# Patient Record
Sex: Male | Born: 1960
Health system: Southern US, Community
[De-identification: ages and names within clinical notes are randomized; demographics above are authoritative.]

## PROBLEM LIST (undated history)

## (undated) DIAGNOSIS — R0602 Shortness of breath: Secondary | ICD-10-CM

## (undated) DIAGNOSIS — J449 Chronic obstructive pulmonary disease, unspecified: Secondary | ICD-10-CM

## (undated) DIAGNOSIS — J111 Influenza due to unidentified influenza virus with other respiratory manifestations: Secondary | ICD-10-CM

## (undated) DIAGNOSIS — J189 Pneumonia, unspecified organism: Secondary | ICD-10-CM

## (undated) DIAGNOSIS — M1990 Unspecified osteoarthritis, unspecified site: Secondary | ICD-10-CM

## (undated) DIAGNOSIS — J40 Bronchitis, not specified as acute or chronic: Secondary | ICD-10-CM

## (undated) HISTORY — PX: NO PAST SURGERIES: SHX2092

---

## 2010-05-30 ENCOUNTER — Emergency Department (HOSPITAL_COMMUNITY)
Admission: EM | Admit: 2010-05-30 | Discharge: 2010-05-31 | Disposition: A | Discharge status: Home / Self Care | Payer: Self-pay | Attending: Emergency Medicine | Admitting: Emergency Medicine

## 2010-05-30 DIAGNOSIS — IMO0002 Reserved for concepts with insufficient information to code with codable children: Secondary | ICD-10-CM | POA: Insufficient documentation

## 2013-05-10 ENCOUNTER — Inpatient Hospital Stay (HOSPITAL_COMMUNITY)
Admission: EM | Admit: 2013-05-10 | Discharge: 2013-05-12 | DRG: 193 | Disposition: A | Discharge status: Home / Self Care | Payer: No Typology Code available for payment source | Attending: Internal Medicine | Admitting: Internal Medicine

## 2013-05-10 ENCOUNTER — Observation Stay (HOSPITAL_COMMUNITY): Payer: No Typology Code available for payment source

## 2013-05-10 ENCOUNTER — Encounter (HOSPITAL_COMMUNITY): Payer: Self-pay | Admitting: Emergency Medicine

## 2013-05-10 ENCOUNTER — Emergency Department (HOSPITAL_COMMUNITY): Payer: No Typology Code available for payment source

## 2013-05-10 DIAGNOSIS — R0603 Acute respiratory distress: Secondary | ICD-10-CM | POA: Diagnosis present

## 2013-05-10 DIAGNOSIS — D72829 Elevated white blood cell count, unspecified: Secondary | ICD-10-CM | POA: Diagnosis present

## 2013-05-10 DIAGNOSIS — J189 Pneumonia, unspecified organism: Principal | ICD-10-CM

## 2013-05-10 DIAGNOSIS — J4489 Other specified chronic obstructive pulmonary disease: Secondary | ICD-10-CM

## 2013-05-10 DIAGNOSIS — R0602 Shortness of breath: Secondary | ICD-10-CM

## 2013-05-10 DIAGNOSIS — J9600 Acute respiratory failure, unspecified whether with hypoxia or hypercapnia: Secondary | ICD-10-CM | POA: Diagnosis present

## 2013-05-10 DIAGNOSIS — J449 Chronic obstructive pulmonary disease, unspecified: Secondary | ICD-10-CM

## 2013-05-10 DIAGNOSIS — Z720 Tobacco use: Secondary | ICD-10-CM | POA: Diagnosis present

## 2013-05-10 DIAGNOSIS — R059 Cough, unspecified: Secondary | ICD-10-CM

## 2013-05-10 DIAGNOSIS — F1210 Cannabis abuse, uncomplicated: Secondary | ICD-10-CM | POA: Diagnosis present

## 2013-05-10 DIAGNOSIS — R0902 Hypoxemia: Secondary | ICD-10-CM

## 2013-05-10 DIAGNOSIS — F1410 Cocaine abuse, uncomplicated: Secondary | ICD-10-CM | POA: Diagnosis present

## 2013-05-10 DIAGNOSIS — R05 Cough: Secondary | ICD-10-CM

## 2013-05-10 DIAGNOSIS — I472 Ventricular tachycardia, unspecified: Secondary | ICD-10-CM | POA: Diagnosis not present

## 2013-05-10 DIAGNOSIS — F17200 Nicotine dependence, unspecified, uncomplicated: Secondary | ICD-10-CM

## 2013-05-10 DIAGNOSIS — J441 Chronic obstructive pulmonary disease with (acute) exacerbation: Secondary | ICD-10-CM

## 2013-05-10 DIAGNOSIS — I4729 Other ventricular tachycardia: Secondary | ICD-10-CM | POA: Diagnosis not present

## 2013-05-10 DIAGNOSIS — Z79899 Other long term (current) drug therapy: Secondary | ICD-10-CM

## 2013-05-10 HISTORY — DX: Shortness of breath: R06.02

## 2013-05-10 HISTORY — DX: Pneumonia, unspecified organism: J18.9

## 2013-05-10 HISTORY — DX: Chronic obstructive pulmonary disease, unspecified: J44.9

## 2013-05-10 HISTORY — DX: Unspecified osteoarthritis, unspecified site: M19.90

## 2013-05-10 HISTORY — DX: Bronchitis, not specified as acute or chronic: J40

## 2013-05-10 LAB — BASIC METABOLIC PANEL WITH GFR
GFR calc non Af Amer: >90 mL/min (ref >90)
Glucose, Bld: 103 mg/dL — ABNORMAL HIGH (ref 70–99)
Potassium: 4.1 meq/L (ref 3.7–5.3)

## 2013-05-10 LAB — I-STAT VENOUS BLOOD GAS, ED
Acid-base deficit: 1 mmol/L (ref 0.0–2.0)
Bicarbonate: 23.3 mEq/L (ref 20.0–24.0)
O2 Saturation: 94 %
TCO2: 25 mmol/L (ref 0–100)
pCO2, Ven: 39 mmHg — ABNORMAL LOW (ref 45.0–50.0)
pH, Ven: 7.385 — ABNORMAL HIGH (ref 7.250–7.300)
pO2, Ven: 70 mmHg — ABNORMAL HIGH (ref 30.0–45.0)

## 2013-05-10 LAB — BASIC METABOLIC PANEL
BUN: 9 mg/dL (ref 6–23)
CO2: 23 mEq/L (ref 19–32)
Calcium: 9.5 mg/dL (ref 8.4–10.5)
Chloride: 100 mEq/L (ref 96–112)
Creatinine, Ser: 0.66 mg/dL (ref 0.50–1.35)
GFR calc Af Amer: >90 mL/min (ref >90)
Sodium: 138 mEq/L (ref 137–147)

## 2013-05-10 LAB — CBC WITH DIFFERENTIAL/PLATELET
Basophils Absolute: 0 K/uL (ref 0.0–0.1)
Basophils Relative: 0 % (ref 0–1)
Eosinophils Absolute: 0 K/uL (ref 0.0–0.7)
Eosinophils Relative: 0 % (ref 0–5)
HCT: 46.6 % (ref 39.0–52.0)
Hemoglobin: 16 g/dL (ref 13.0–17.0)
Lymphocytes Relative: 4 % — ABNORMAL LOW (ref 12–46)
Lymphs Abs: 0.8 K/uL (ref 0.7–4.0)
MCH: 30.5 pg (ref 26.0–34.0)
MCHC: 34.3 g/dL (ref 30.0–36.0)
MCV: 88.8 fL (ref 78.0–100.0)
Monocytes Absolute: 1.8 10*3/uL — ABNORMAL HIGH (ref 0.1–1.0)
Monocytes Relative: 9 % (ref 3–12)
Neutro Abs: 17.4 10*3/uL — ABNORMAL HIGH (ref 1.7–7.7)
Neutrophils Relative %: 87 % — ABNORMAL HIGH (ref 43–77)
Platelets: 255 K/uL (ref 150–400)
RBC: 5.25 MIL/uL (ref 4.22–5.81)
RDW: 13.8 % (ref 11.5–15.5)
WBC: 20 K/uL — ABNORMAL HIGH (ref 4.0–10.5)

## 2013-05-10 LAB — TROPONIN I

## 2013-05-10 MED ORDER — ALBUTEROL SULFATE (2.5 MG/3ML) 0.083% IN NEBU
5.0000 mg | INHALATION_SOLUTION | Freq: Once | RESPIRATORY_TRACT | Status: AC
  Administered 2013-05-10: 5 mg via RESPIRATORY_TRACT
  Filled 2013-05-10: qty 6

## 2013-05-10 MED ORDER — PREDNISONE 20 MG PO TABS
40.0000 mg | ORAL_TABLET | Freq: Every day | ORAL | Status: DC
  Filled 2013-05-10 (×2): qty 2

## 2013-05-10 MED ORDER — IPRATROPIUM-ALBUTEROL 0.5-2.5 (3) MG/3ML IN SOLN
3.0000 mL | RESPIRATORY_TRACT | Status: DC
  Administered 2013-05-10 – 2013-05-11 (×7): 3 mL via RESPIRATORY_TRACT
  Filled 2013-05-10 (×6): qty 3

## 2013-05-10 MED ORDER — PNEUMOCOCCAL VAC POLYVALENT 25 MCG/0.5ML IJ INJ
0.5000 mL | INJECTION | INTRAMUSCULAR | Status: AC
  Administered 2013-05-11: 0.5 mL via INTRAMUSCULAR
  Filled 2013-05-10: qty 0.5

## 2013-05-10 MED ORDER — HYDROCOD POLST-CHLORPHEN POLST 10-8 MG/5ML PO LQCR
5.0000 mL | Freq: Once | ORAL | Status: AC
  Administered 2013-05-10: 5 mL via ORAL
  Filled 2013-05-10: qty 5

## 2013-05-10 MED ORDER — ALBUTEROL SULFATE (2.5 MG/3ML) 0.083% IN NEBU
2.5000 mg | INHALATION_SOLUTION | RESPIRATORY_TRACT | Status: DC
  Administered 2013-05-10: 2.5 mg via RESPIRATORY_TRACT
  Filled 2013-05-10: qty 3

## 2013-05-10 MED ORDER — IPRATROPIUM BROMIDE 0.02 % IN SOLN
0.5000 mg | Freq: Once | RESPIRATORY_TRACT | Status: AC
  Administered 2013-05-10: 0.5 mg via RESPIRATORY_TRACT
  Filled 2013-05-10: qty 2.5

## 2013-05-10 MED ORDER — AZITHROMYCIN 250 MG PO TABS
500.0000 mg | ORAL_TABLET | Freq: Once | ORAL | Status: AC
  Administered 2013-05-10: 500 mg via ORAL
  Filled 2013-05-10: qty 2

## 2013-05-10 MED ORDER — ENOXAPARIN SODIUM 40 MG/0.4ML ~~LOC~~ SOLN
40.0000 mg | SUBCUTANEOUS | Status: DC
  Administered 2013-05-10 – 2013-05-11 (×2): 40 mg via SUBCUTANEOUS
  Filled 2013-05-10 (×3): qty 0.4

## 2013-05-10 MED ORDER — ASPIRIN EC 81 MG PO TBEC
81.0000 mg | DELAYED_RELEASE_TABLET | Freq: Every day | ORAL | Status: DC
  Administered 2013-05-10 – 2013-05-12 (×3): 81 mg via ORAL
  Filled 2013-05-10 (×3): qty 1

## 2013-05-10 MED ORDER — AZITHROMYCIN 500 MG PO TABS
500.0000 mg | ORAL_TABLET | Freq: Every day | ORAL | Status: DC
  Filled 2013-05-10: qty 1

## 2013-05-10 MED ORDER — DEXTROSE 5 % IV SOLN
1.0000 g | Freq: Once | INTRAVENOUS | Status: AC
  Administered 2013-05-10: 1 g via INTRAVENOUS
  Filled 2013-05-10: qty 10

## 2013-05-10 MED ORDER — SODIUM CHLORIDE 0.9 % IJ SOLN
3.0000 mL | Freq: Two times a day (BID) | INTRAMUSCULAR | Status: DC
  Administered 2013-05-11 – 2013-05-12 (×3): 3 mL via INTRAVENOUS

## 2013-05-10 MED ORDER — METHYLPREDNISOLONE SODIUM SUCC 125 MG IJ SOLR
125.0000 mg | Freq: Once | INTRAMUSCULAR | Status: AC
  Administered 2013-05-10: 125 mg via INTRAVENOUS
  Filled 2013-05-10: qty 2

## 2013-05-10 MED ORDER — SODIUM CHLORIDE 0.9 % IV SOLN
INTRAVENOUS | Status: AC
  Administered 2013-05-10: 18:00:00 via INTRAVENOUS

## 2013-05-10 NOTE — ED Notes (Signed)
Cough sob and wheezing running a fever x 3 days pulse ox 89 ra 109

## 2013-05-10 NOTE — ED Notes (Signed)
Pt states he has had a cough for several days and chest pain accompanied the coughing. States he is coughing up green/yellow mucous. Pt states he has had a fever, diarrhea but denies vomiting. This has been going on for about 2 days.

## 2013-05-10 NOTE — Progress Notes (Signed)
Pt arrived from the ED via stretcher accompanied by tech on cardiac monitor and O2. Pt with increased work of breathing but stable and in no other apparent discomfort or distress. Pt able to ambulate from ED stretcher to hospital bed without assistance. Vitals obtained - VSS. Pt placed on tele monitor and TMU notified. Dr. Darci Needlehikowski notified of patient's arrival and she stated the team would be up in approx. 1/2 hour to assess and write admission orders. Pt resting comfortably in bed. Bed low and locked, side rails up x2, phone and call light within reach. Pt oriented to room/bed/unit.  Will continue to monitor.  Asher Muir-[place] Chelf,RN

## 2013-05-10 NOTE — ED Notes (Signed)
Respiratory therapy at bedside. RT does not believe that treatments improved pt's condition/ feels that pt has rhonchi rather than wheezing.

## 2013-05-10 NOTE — ED Provider Notes (Signed)
CSN: [id number]     Arrival date & time 05/10/13  1120 History   First MD Initiated Contact with Patient 05/10/13 1134     Chief Complaint  Patient presents with  . Shortness of Breath     (Consider location/radiation/quality/duration/timing/severity/associated sxs/prior Treatment) Patient is a 53 y.o. male presenting with shortness of breath. The history is provided by the patient and medical records.  Shortness of Breath Severity:  Severe Onset quality:  Gradual Duration:  2 days Timing:  Constant Progression:  Worsening Chronicity:  New Context: pollens   Relieved by:  Nothing Worsened by:  Coughing Associated symptoms: chest pain, cough and fever   Associated symptoms: no abdominal pain, no diaphoresis and no vomiting   Associated symptoms comment:  Diarrhea  Cough:    Cough characteristics:  Productive   Past Medical History  Diagnosis Date  . Bronchitis    History reviewed. No pertinent past surgical history. No family history on file. History  Substance Use Topics  . Smoking status: Smoker, Current Status Unknown  . Smokeless tobacco: Not on file  . Alcohol Use: Yes    Review of Systems  Constitutional: Positive for fever, chills and fatigue. Negative for diaphoresis.  Respiratory: Positive for cough and shortness of breath.   Cardiovascular: Positive for chest pain.  Gastrointestinal: Positive for diarrhea. Negative for nausea, vomiting and abdominal pain.  Musculoskeletal: Positive for myalgias.  Skin: Negative for color change.  All other systems reviewed and are negative.     Allergies  Review of patient's allergies indicates no known allergies.  Home Medications   Prior to Admission medications   Not on File   BP 128/67  Pulse 112  Temp(Src) 98.2 F (36.8 C) (Oral)  Resp 30  SpO2 96% Physical Exam  Nursing note and vitals reviewed. Constitutional: He appears well-developed.  HENT:  Head: Normocephalic and atraumatic.  Neck: Normal  range of motion. Neck supple.  Cardiovascular: Regular rhythm.   Pulmonary/Chest: Accessory muscle usage present. Tachypnea noted. He is in respiratory distress. He has decreased breath sounds. He has wheezes. He has no rhonchi. He has no rales.  Abdominal: Soft.  Neurological: He is alert.  Skin: Skin is warm.    ED Course  Procedures (including critical care time)   CRITICAL CARE Performed by: Gavin PoundMichael Y. Haiven Nardone Total critical care time: 30 min Critical care time was exclusive of separately billable procedures and treating other patients. Critical care was necessary to treat or prevent imminent or life-threatening deterioration. Critical care was time spent personally by me on the following activities: development of treatment plan with patient and/or surrogate as well as nursing, discussions with consultants, evaluation of patient's response to treatment, examination of patient, obtaining history from patient or surrogate, ordering and performing treatments and interventions, ordering and review of laboratory studies, ordering and review of radiographic studies, pulse oximetry and re-evaluation of patient's condition.  Labs Review Labs Reviewed  CBC WITH DIFFERENTIAL - Abnormal; Notable for the following:    WBC 20.0 (*)    Neutrophils Relative % 87 (*)    Neutro Abs 17.4 (*)    Lymphocytes Relative 4 (*)    Monocytes Absolute 1.8 (*)    All other components within normal limits  BASIC METABOLIC PANEL - Abnormal; Notable for the following:    Glucose, Bld 103 (*)    All other components within normal limits  I-STAT VENOUS BLOOD GAS, ED - Abnormal; Notable for the following:    pH, Ven 7.385 (*)  pCO2, Ven 39.0 (*)    pO2, Ven 70.0 (*)    All other components within normal limits    Imaging Review Dg Chest Port 1 View  05/10/2013   CLINICAL DATA:  Chest pain.  EXAM: PORTABLE CHEST - 1 VIEW  COMPARISON:  None.  FINDINGS: Left apical pleural parenchymal thickening with slight  nodularity is noted. Although this is most likely related to scarring a left apical lung cancer cannot be excluded. Chest CT can be obtained on a nonemergent basis for further evaluation. Bibasilar subsegmental atelectasis and or mild infiltrates noted. COPD. Mediastinum hilar structures normal. Heart size normal. No focal bony abnormality.  IMPRESSION: 1. Nodular left apical pleural parenchymal thickening. This is most likely related to scarring however tumor cannot be excluded and nonemergent contrast enhanced chest CT suggested for further evaluation . 2. Mild bibasilar subsegmental atelectasis and/or infiltrates. 3. COPD.   Electronically Signed   By: Maisie Fushomas  Register   On: 05/10/2013 12:12     EKG Interpretation None     RA sat is 89% and I interpret to be inadequate  supplemntal O2 provided.    12:44 PM Given WBC of 20, despite no definitive infiltrate on CXR, productive cough and fevers at home, will treat as CAP and give IV abx and azithromycin and consult medicine for admission.   MDM   Final diagnoses:  Community acquired pneumonia  COPD exacerbation  Hypoxia    Pt isin resp distress, pt's habitus suggests undiagnosed COPD.  Pt with fevers subjective at home, tachypnea, poor air movement.  Likely COPD exacerbation, resp distress, either allergies or infection as source.  Will get VBG to look at pH and pCO2, needs extra O2, will start steroids and consider abx.  Labs, CXR and needs admission.  Pt is not impending airway compromise or fatigue at this point clinically, but will need clsoe monitoring in the near future.      Gavin PoundMichael Y. Vadie Principato, MD 05/10/13 1246

## 2013-05-10 NOTE — H&P (Signed)
Date: 05/10/2013               Patient Name:  Joseph Ellis MRN: [id number]  DOB: January 08, 1961 Age / Sex: 53 y.o., male   PCP: No primary provider on file.         Medical Service: Internal Medicine Teaching Service         Attending Physician: Dr. Aletta EdouardShilpa Bhardwaj, MD    First Contact: Dr. Darci Needlehikowski Pager: [phone]  Second Contact: Dr. Sherrine MaplesGlenn Pager: [phone]       After Hours (After 5p/  First Contact Pager: [phone]  weekends / holidays): Second Contact Pager: [phone]   Chief Complaint: SOB  History of Present Illness:  This is [name] with no known PMH aside from long hx of smoking cigarettes who presents with c/o worsening SOB with cough productive of yellow sputum x 2 days. Patient also reports having some constant chest tightness since yesterday. The chest pain has never occurred before and is located "all over" his chest. Does not radiate. The pain was relieved somewhat by breathing tx in ED. Patient endorses having chills (did not check temperature) some rhinorrhea and nasal congestion recently. Also endorses having an episode of watery, nonbloody diarrhea yesterday. Denies N/V, abd pain, dysuria, hematuria, palpitations, ST. He does have some baseline DOE and cough, but this episode is worse than his usual. He takes no medications. He has not seen a PCP in years. 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   In the ED, pt was afebrile, HR 90-112, RR 30, hypoxic to 89% on room air, so was placed on 4LPM supplemental O2. He was given duoneb, which improved symptoms, as well as rocephin 1g IV, azithromycin 500mg  and solumedrol 125mg  IV. Leukocytosis 20K. CXR showed COPD changes with nodular L apical pleural thickening and mild bibasilar atelectasis vs infiltrates. No prior CXR to compare.  Meds: No current facility-administered medications for this encounter.   No current outpatient prescriptions on file.    Allergies: Allergies as of 05/10/2013  . (No Known Allergies)     Past Medical History  Diagnosis Date  . Bronchitis    History reviewed. No pertinent past surgical history. History reviewed. No pertinent family history. History   Social History  . Marital Status: Married    Spouse Name: N/A    Number of Children: N/A  . Years of Education: N/A   Occupational History  . Not on file.   Social History Main Topics  . Smoking status: Smoker, Current Status Unknown- 1ppd x 30 years  . Smokeless tobacco: Not on file  . Alcohol Use: Yes- drinks an 18pack of beer on weekends, no use during the week   . Drug Use: Yes- marijuana, cocaine (last used a few days ago)    Special: Marijuana  . Sexual Activity: Not on file   Other Topics Concern  . Not on file   Social History Narrative  . No narrative on file    Review of Systems: A comprehensive full 10 point ROS was performed and aside from what is noted in the HPI, rest of systems negative.  Physical Exam: Blood pressure 124/71, pulse 112, temperature 98.2 F (36.8 C), temperature source Oral, resp. rate 30, SpO2 96.00%. (91% on room air during our exam) General: alert, cooperative, slight increased WOB HEENT: pupils equal round and reactive to light, vision grossly intact, oropharynx clear and non-erythematous  Neck: supple Lungs: diminished breath sounds throughout, though clear to ascultation bilaterally,  slight increased WOB Heart: regular rate and rhythm, no murmurs, gallops, or rubs Abdomen: soft, very thin, non-tender, non-distended, normal bowel sounds Extremities: no pedal edema, warm and well perfused extremities Neurologic: alert & oriented X3, cranial nerves II-XII grossly intact, strength grossly intact, sensation intact to light touch  Lab results: Basic Metabolic Panel:  Recent Labs  16/10/9602/28/15 1145  NA 138  K 4.1  CL 100  CO2 23  GLUCOSE 103*  BUN 9  CREATININE 0.66  CALCIUM 9.5   CBC:  Recent Labs  05/10/13 1145  WBC 20.0*  NEUTROABS 17.4*  HGB 16.0  HCT  46.6  MCV 88.8  PLT 255   Venous Blood Gas result:  pO2 70; pCO2 39; pH 7.38;  HCO3 23.3, %O2 Sat 94.  Imaging results:  Dg Chest Port 1 View  05/10/2013   CLINICAL DATA:  Chest pain.  EXAM: PORTABLE CHEST - 1 VIEW  COMPARISON:  None.  FINDINGS: Left apical pleural parenchymal thickening with slight nodularity is noted. Although this is most likely related to scarring a left apical lung cancer cannot be excluded. Chest CT can be obtained on a nonemergent basis for further evaluation. Bibasilar subsegmental atelectasis and or mild infiltrates noted. COPD. Mediastinum hilar structures normal. Heart size normal. No focal bony abnormality.  IMPRESSION: 1. Nodular left apical pleural parenchymal thickening. This is most likely related to scarring however tumor cannot be excluded and nonemergent contrast enhanced chest CT suggested for further evaluation . 2. Mild bibasilar subsegmental atelectasis and/or infiltrates. 3. COPD.   Electronically Signed   By: Maisie Fushomas  Register   On: 05/10/2013 12:12   Other results: EKG: not done in ED  Assessment & Plan by Problem:  # SOB and hypoxia in the setting of chronic tobacco abuse: Patient with increased SOB and cough w/ sputum color and quantity change over the last 2 days (no hx of prior similar episodes, though does endorse hx of prior bronchitis). Pt with long standing hx of tobacco abuse w/ changes of COPD on his CXR. Pt responded well to duonebs in ED. Although patient does not have documented hx of COPD, I suspect this represents a COPD exacerbation. However, CXR on admission also showed mild bibasilar atelectasis vs infiltrate so PNA cannot be ruled out. Pt also with some mild tachycardia (seems to be resolving), subjective chills (though afebrile so far here), and a leukocytosis to 20K on admission--all signs of possible infection such as PNA. Patient satting 91% on room air during our exam, 2LPM O2 placed for comfort. I think it is worth repeating a 2 view  CXR as the CXR done in the ED was a portable, 1 view. Patient received rocephin 1g and azithromycin 500mg  in ED already today. BCX not drawn prior to antibiotic administration.  -admit to telemetry -duonebs q4h scheduled  -BMP, CBC in AM -cycle troponins -repeat CXR given questionable infiltrates  -plan to continue rocephin and azithromycin for now, though may discontinue in AM -prednisone 40mg  PO daily -EKG -NS at 100cc/hr until he tolerates diet -tussionex for cough -BCx x 2 (drawn after abx started) -supplemental oxygen to keep O2 saturation >92%  #CXR abnormality: Patient with nodular L apical pleural thickening noted on portable CXR in ED. Will repeat CXR and obtain 2 view. This is somewhat concerning for malignancy as patient with long hx of tobacco abuse. -repeat CXR   # VTE: lovenox  # Diet: regular  Code status: Full  Dispo: Disposition is deferred at this time, awaiting improvement of  current medical problems. Anticipated discharge in approximately 1-2 day(s).   The patient does not have a current PCP (No primary provider on file.) and does need an [facility] hospital follow-up appointment after discharge.  The patient does not have transportation limitations that hinder transportation to clinic appointments.  Signed: Windell Hummingbird, MD 05/10/2013, 2:31 PM

## 2013-05-11 ENCOUNTER — Other Ambulatory Visit: Payer: Self-pay

## 2013-05-11 DIAGNOSIS — Z87891 Personal history of nicotine dependence: Secondary | ICD-10-CM

## 2013-05-11 DIAGNOSIS — J9600 Acute respiratory failure, unspecified whether with hypoxia or hypercapnia: Secondary | ICD-10-CM

## 2013-05-11 DIAGNOSIS — J189 Pneumonia, unspecified organism: Principal | ICD-10-CM

## 2013-05-11 LAB — CBC
HCT: 38.7 % — ABNORMAL LOW (ref 39.0–52.0)
Hemoglobin: 13 g/dL (ref 13.0–17.0)
MCH: 29.7 pg (ref 26.0–34.0)
MCHC: 33.6 g/dL (ref 30.0–36.0)
MCV: 88.6 fL (ref 78.0–100.0)
Platelets: 224 10*3/uL (ref 150–400)
RBC: 4.37 MIL/uL (ref 4.22–5.81)
RDW: 13.9 % (ref 11.5–15.5)
WBC: 17.5 10*3/uL — ABNORMAL HIGH (ref 4.0–10.5)

## 2013-05-11 LAB — BASIC METABOLIC PANEL
BUN: 13 mg/dL (ref 6–23)
CO2: 24 mEq/L (ref 19–32)
CREATININE: 0.74 mg/dL (ref 0.50–1.35)
Calcium: 9.2 mg/dL (ref 8.4–10.5)
Chloride: 104 mEq/L (ref 96–112)
GFR calc Af Amer: >90 mL/min (ref >90)
GFR calc non Af Amer: >90 mL/min (ref >90)
Glucose, Bld: 109 mg/dL — ABNORMAL HIGH (ref 70–99)
Potassium: 4.2 mEq/L (ref 3.7–5.3)
Sodium: 142 mEq/L (ref 137–147)

## 2013-05-11 LAB — TROPONIN I: Troponin I: <0.3 ng/mL (ref <0.30)

## 2013-05-11 MED ORDER — LEVOFLOXACIN 750 MG PO TABS
750.0000 mg | ORAL_TABLET | Freq: Every day | ORAL | Status: DC
  Administered 2013-05-11 – 2013-05-12 (×2): 750 mg via ORAL
  Filled 2013-05-11 (×2): qty 1

## 2013-05-11 MED ORDER — IPRATROPIUM-ALBUTEROL 0.5-2.5 (3) MG/3ML IN SOLN
3.0000 mL | Freq: Four times a day (QID) | RESPIRATORY_TRACT | Status: DC
  Administered 2013-05-12 (×2): 3 mL via RESPIRATORY_TRACT
  Filled 2013-05-11 (×3): qty 3

## 2013-05-11 NOTE — Progress Notes (Signed)
Subjective: Patient feels significantly better than yesterday. He is still more short of breath than his baseline, though improving and responds well to duonebs. Still with cough productive of yellow mucous. No CP, palpitations, N/V/D, abd pain.  Objective: Vital signs in last 24 hours: Filed Vitals:   05/10/13 2012 05/10/13 2212 05/10/13 2341 05/11/13 0342  BP:  122/56    Pulse:  79    Temp:  98.9 F (37.2 C)    TempSrc:  Oral    Resp:  22    Height:  5\' 9"  (1.753 m)    Weight:  124 lb 4.8 oz (56.382 kg)    SpO2: 95% 93% 94% 94%   Weight change:   Intake/Output Summary (Last 24 hours) at 05/11/13 0737 Last data filed at 05/10/13 2300  Gross per 24 hour  Intake    760 ml  Output      0 ml  Net    760 ml   Physical Exam: General: alert, cooperative, breathing more comfortably today HEENT: vision grossly intact, oropharynx clear and non-erythematous  Neck: supple Lungs: better air movement today with rhonchi to R base, left lung CTA, slight increased WOB; + cough Heart: RRR Abdomen: soft, very thin, non-tender, non-distended, normal bowel sounds  Extremities: no pedal edema, warm and well perfused extremities Neurologic: alert & oriented X3, cranial nerves II-XII grossly intact, strength grossly intact, sensation intact to light touch  Lab Results: Basic Metabolic Panel:  Recent Labs Lab 05/10/13 1145 05/11/13 0422  NA 138 142  K 4.1 4.2  CL 100 104  CO2 23 24  GLUCOSE 103* 109*  BUN 9 13  CREATININE 0.66 0.74  CALCIUM 9.5 9.2   CBC:  Recent Labs Lab 05/10/13 1145 05/11/13 0422  WBC 20.0* 17.5*  NEUTROABS 17.4*  --   HGB 16.0 13.0  HCT 46.6 38.7*  MCV 88.8 88.6  PLT 255 224   Cardiac Enzymes:  Recent Labs Lab 05/10/13 2008 05/11/13 0422  TROPONINI <0.30 <0.30   Micro Results: No results found for this or any previous visit (from the past 240 hour(s)). Studies/Results: Dg Chest 2 View  05/11/2013   CLINICAL DATA:  Fever, cough and  shortness of breath. History of smoking.  EXAM: CHEST  2 VIEW  COMPARISON:  Chest radiograph performed earlier today 11:45 a.m.  FINDINGS: Focal nodular opacity at the left lung base could reflect mild infection, or possibly the left nipple shadow, better characterized than on the prior study. No pleural effusion or pneumothorax is seen. The lungs are hyperexpanded, with flattening of the hemidiaphragms, compatible with COPD. Mild scarring is again noted at the left lung apex.  The heart remains normal in size. No acute osseous abnormalities are identified.  IMPRESSION: 1. Focal nodular opacity at the left lung base is more prominent than on the recent prior study and may reflect mild infection, or possibly the left nipple shadow. A follow-up chest radiograph with nipple markers could be considered for further evaluation. Alternatively, if the patient has symptoms compatible with pneumonia, would consider treating for pneumonia, and performing follow-up chest radiograph after completion of treatment, to ensure resolution of airspace opacity. 2. Findings of COPD.   Electronically Signed   By: Roanna RaiderJeffery  Chang M.D.   On: 05/11/2013 03:36   Dg Chest Port 1 View  05/10/2013   CLINICAL DATA:  Chest pain.  EXAM: PORTABLE CHEST - 1 VIEW  COMPARISON:  None.  FINDINGS: Left apical pleural parenchymal thickening with slight nodularity is noted.  Although this is most likely related to scarring a left apical lung cancer cannot be excluded. Chest CT can be obtained on a nonemergent basis for further evaluation. Bibasilar subsegmental atelectasis and or mild infiltrates noted. COPD. Mediastinum hilar structures normal. Heart size normal. No focal bony abnormality.  IMPRESSION: 1. Nodular left apical pleural parenchymal thickening. This is most likely related to scarring however tumor cannot be excluded and nonemergent contrast enhanced chest CT suggested for further evaluation . 2. Mild bibasilar subsegmental atelectasis and/or  infiltrates. 3. COPD.   Electronically Signed   By: Maisie Fushomas  Register   On: 05/10/2013 12:12   Medications: I have reviewed the patient's current medications. Scheduled Meds: . aspirin EC  81 mg Oral Daily  . azithromycin  500 mg Oral Daily  . enoxaparin (LOVENOX) injection  40 mg Subcutaneous Q24H  . ipratropium-albuterol  3 mL Nebulization Q4H  . pneumococcal 23 valent vaccine  0.5 mL Intramuscular Tomorrow-1000  . sodium chloride  3 mL Intravenous Q12H   Continuous Infusions:  PRN Meds:.  Assessment/Plan:  # Acute respiratory failure 2/2 CAP: Breathing has improved w/ duonebs, abx, and steroids (received in ED only). Patient remains hypoxic to 88% on room air today, giving supplemental oxygen. Rest of VSS. Leukocytosis down trending, WBC 20-->17.5. Repeat CXR done last night shows LLL nodular opacity ?nipple shadow vs PNA (radiologist favors PNA if pt w/ symptoms of PNA). Will plan to treat for CAP. Discontinue steroids as treating for PNA. Transitioning to levaquin PO today (s/p azithro/rocephin in ED). Pt will need to be set up with PCP and will need f/u CXR in 4-6 weeks. Of note, pt with some chest pain on admission and his troponin x 3 negative and EKG wnl. -levaquin 750mg  PO daily (day 2/5) -f/u BCx - NGTD -duonebs q4h -CBC in AM -will plan to discharge with spiriva and albuterol inhaler  -tussionex prn -supplemental oxygen to keep O2 saturation >92%   #CXR abnormality: Patient with nodular L apical pleural thickening noted on CXR. This is somewhat concerning for malignancy as patient with long hx of tobacco abuse.  -will need outpatient follow up and monitoring of this abnormality   # VTE: lovenox   # Diet: regular   Code status: Full   Dispo: Disposition is deferred at this time, awaiting improvement of current medical problems.  Anticipated discharge in approximately 1-2 day(s).   The patient does not have a current PCP (No primary provider on file.) and [facility] after discharge.  The patient does not have transportation limitations that hinder transportation to clinic appointments.  .Services Needed at time of discharge: Y = Yes, Blank = No PT:   OT:   RN:   Equipment:   Other:     LOS: 1 day   Windell Hummingbirdachel Essence Merle, MD 05/11/2013, 7:37 AM

## 2013-05-11 NOTE — Progress Notes (Signed)
Tele 9 beat run v-tach, paged Dr. Ihor Austinhilowski

## 2013-05-11 NOTE — Discharge Summary (Signed)
Name: Joseph Ellis MRN: [id number] DOB: August 25, 1960 53 y.o. PCP: No primary provider on file.  Date of Admission: 05/10/2013 11:27 AM Date of Discharge: 05/12/2013 Attending Physician: Dr. Rogelia Boga  Discharge Diagnosis: Principal Problem:   Acute respiratory failure 2/2 likely CAP combined with COPD Active Problems:   Respiratory distress   COPD (chronic obstructive pulmonary disease)- no PFTs, but presumed given hx of smoking and CXR findings   Smoker, ~30 pack year hx   Leukocytosis   CAP (community acquired pneumonia)  Discharge Medications:   Medication List         albuterol 108 (90 BASE) MCG/ACT inhaler  Commonly known as:  PROVENTIL HFA;VENTOLIN HFA  Inhale 2 puffs into the lungs every 6 (six) hours as needed for wheezing or shortness of breath.     dextromethorphan-guaiFENesin 30-600 MG per 12 hr tablet  Commonly known as:  MUCINEX DM  Take 1 tablet by mouth 2 (two) times daily.     levofloxacin 750 MG tablet  Commonly known as:  LEVAQUIN  Take 1 tablet (750 mg total) by mouth daily.  Start taking on:  05/13/2013     tiotropium 18 MCG inhalation capsule  Commonly known as:  SPIRIVA HANDIHALER  Place 1 capsule (18 mcg total) into inhaler and inhale daily.       Disposition and follow-up:   Joseph Ellis was discharged from [facility] in Stable condition.  At the hospital follow up visit please address:  1.  Patient will need outpatient PFTs after acute illness improves 2. CAP- Compliance with levaquin 750mg  daily x 2 more days? Will need repeat CXR in 4-6 weeks to assess for resolution of PNA  3. CXR abnormality- Initial CXR showed nodular left apical pleural parenchymal thickening, scarring vs. Mass. CT chest w/ contrast recommended as outpatient to further assess. 4. Pt discharged with spiriva and albuterol inhaler; did he get these filled?  2.  Labs / imaging needed at time of follow-up: none  3.  Pending labs/ test needing  follow-up: Bcx x 2  Follow-up Appointments: Follow-up Information   Follow up with Janalyn Harder, MD On 05/20/2013. (2pm)    Specialty:  Internal Medicine   Contact information:   [address] [place] [address] [phone]       Discharge Instructions: Discharge Orders   Future Appointments Provider Department Dept Phone   05/20/2013 2:00 PM Linward Headland, MD [facility] [phone]   Future Orders Complete By Expires   Call MD for:  difficulty breathing, headache or visual disturbances  As directed    Call MD for:  extreme fatigue  As directed    Call MD for:  persistant dizziness or light-headedness  As directed    Diet - low sodium heart healthy  As directed    Increase activity slowly  As directed      Consultations:  none  Procedures Performed:  Dg Chest 2 View  05/11/2013   CLINICAL DATA:  Fever, cough and shortness of breath. History of smoking.  EXAM: CHEST  2 VIEW  COMPARISON:  Chest radiograph performed earlier today 11:45 a.m.  FINDINGS: Focal nodular opacity at the left lung base could reflect mild infection, or possibly the left nipple shadow, better characterized than on the prior study. No pleural effusion or pneumothorax is seen. The lungs are hyperexpanded, with flattening of the hemidiaphragms, compatible with COPD. Mild scarring is again noted at the left lung apex.  The heart  remains normal in size. No acute osseous abnormalities are identified.  IMPRESSION: 1. Focal nodular opacity at the left lung base is more prominent than on the recent prior study and may reflect mild infection, or possibly the left nipple shadow. A follow-up chest radiograph with nipple markers could be considered for further evaluation. Alternatively, if the patient has symptoms compatible with pneumonia, would consider treating for pneumonia, and performing follow-up chest radiograph after completion of treatment, to ensure resolution of airspace opacity. 2. Findings  of COPD.   Electronically Signed   By: Roanna RaiderJeffery  Chang M.D.   On: 05/11/2013 03:36   Dg Chest Port 1 View  05/10/2013   CLINICAL DATA:  Chest pain.  EXAM: PORTABLE CHEST - 1 VIEW  COMPARISON:  None.  FINDINGS: Left apical pleural parenchymal thickening with slight nodularity is noted. Although this is most likely related to scarring a left apical lung cancer cannot be excluded. Chest CT can be obtained on a nonemergent basis for further evaluation. Bibasilar subsegmental atelectasis and or mild infiltrates noted. COPD. Mediastinum hilar structures normal. Heart size normal. No focal bony abnormality.  IMPRESSION: 1. Nodular left apical pleural parenchymal thickening. This is most likely related to scarring however tumor cannot be excluded and nonemergent contrast enhanced chest CT suggested for further evaluation . 2. Mild bibasilar subsegmental atelectasis and/or infiltrates. 3. COPD.   Electronically Signed   By: Maisie Fushomas  Register   On: 05/10/2013 12:12   Admission HPI:  This is [name] with no known PMH aside from long hx of smoking cigarettes who presents with c/o worsening SOB with cough productive of yellow sputum x 2 days. Patient also reports having some constant chest tightness since yesterday. The chest pain has never occurred before and is located "all over" his chest. Does not radiate. The pain was relieved somewhat by breathing tx in ED. Patient endorses having chills (did not check temperature) some rhinorrhea and nasal congestion recently. Also endorses having an episode of watery, nonbloody diarrhea yesterday. Denies N/V, abd pain, dysuria, hematuria, palpitations, ST. He does have some baseline DOE and cough, but this episode is worse than his usual. He takes no medications. He has not seen a PCP in years. 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  In the ED, pt was afebrile, HR 90-112, RR 30, hypoxic to 89% on room air, so was placed on 4LPM supplemental O2. He was given duoneb, which  improved symptoms, as well as rocephin 1g IV, azithromycin 500mg  and solumedrol 125mg  IV. Leukocytosis 20K. CXR showed COPD changes with nodular L apical pleural thickening and mild bibasilar atelectasis vs infiltrates. No prior CXR to compare.  Hospital Course by problem list:  # Acute respiratory failure, CAP and possible COPD exacerbation: Patient with increased SOB and cough w/ sputum color and quantity change over the last 2 days. Pt with long standing hx of tobacco abuse w/ changes of COPD on his CXR. Pt responded well to duonebs in ED. Although patient does not have documented hx of COPD, I suspect patient does have previously undiagnosed COPD. However, CXR on admission (and repeat CXR on 4/29) also showed possible LLL pneumonia. Pt also with mild tachycardia, subjective chills (though afebrile), and a leukocytosis to 20K on admission. Patient treated for CAP. He was given rocephin and azithromycin as well as 1 dose of solumedrol 125mg  IV in the ED and then was transitioned on HD1 to levaquin 750mg  PO daily, steroids discontinued. Patient desatting to 88% in ED,  but was satting 91% on room air during my initial exam, 2LPM O2 placed for comfort. Patient received duonebs q4h scheduled x 48 hours with much improvement of his breathing. No longer desatting at rest or ambulation on room air on morning of discharge. BCX NGTD ( 2 days), though not drawn prior to antibiotic administration. Of note, EKG wnl and troponin x 3 negative so ACS ruled out (pt with some constant chest tightness on admission that resolved prior to discharge). [facility]. He was discharged with spiriva and albuterol inhalers, mucinex for cough, as well as 2 more days of levaquin 750mg  daily.   #CXR abnormality: Patient with nodular L apical pleural thickening noted on portable CXR in ED, cannot rule out malignancy though likely represents scarring. This is somewhat concerning for malignancy as  patient with long hx of tobacco abuse. Radiology recommends f/u with CT chest with contrast for further characterization.    #Tobacco abuse: Patient with a 30 pack year history of smoking cigarettes, currently smoking 1ppd. Discussed with patient the importance of quitting. Pt agrees to attempt to quit, though lives with 2 brothers, both of whom smoke as well. Patient and brothers have made plan for all of them to quit smoking together.   Discharge Vitals:   BP 110/86  Pulse 94  Temp(Src) 97.7 F (36.5 C) (Oral)  Resp 24  Ht 5\' 9"  (1.753 m)  Wt 125 lb 14.4 oz (57.108 kg)  BMI 18.58 kg/m2  SpO2 91%  Discharge Labs:  Results for orders placed during the hospital encounter of 05/10/13 (from the past 24 hour(s))  CBC WITH DIFFERENTIAL     Status: Abnormal   Collection Time    05/12/13  7:55 AM      Result Value Ref Range   WBC 12.2 (*) 4.0 - 10.5 K/uL   RBC 4.46  4.22 - 5.81 MIL/uL   Hemoglobin 13.1  13.0 - 17.0 g/dL   HCT 16.140.7  09.639.0 - 04.552.0 %   MCV 91.3  78.0 - 100.0 fL   MCH 29.4  26.0 - 34.0 pg   MCHC 32.2  30.0 - 36.0 g/dL   RDW 40.914.3  81.111.5 - 91.415.5 %   Platelets 240  150 - 400 K/uL   Neutrophils Relative % 84 (*) 43 - 77 %   Neutro Abs 10.2 (*) 1.7 - 7.7 K/uL   Lymphocytes Relative 8 (*) 12 - 46 %   Lymphs Abs 1.0  0.7 - 4.0 K/uL   Monocytes Relative 8  3 - 12 %   Monocytes Absolute 1.0  0.1 - 1.0 K/uL   Eosinophils Relative 0  0 - 5 %   Eosinophils Absolute 0.0  0.0 - 0.7 K/uL   Basophils Relative 0  0 - 1 %   Basophils Absolute 0.0  0.0 - 0.1 K/uL    Signed: Windell Hummingbirdachel Cristyn Crossno, MD 05/12/2013, 1:18 PM   Time Spent on Discharge: 35 minutes Services Ordered on Discharge: none Equipment Ordered on Discharge: none

## 2013-05-11 NOTE — Progress Notes (Signed)
Pt O2 sat 88% on RA.  Placed pt back on 3 Liters [place].

## 2013-05-11 NOTE — Progress Notes (Signed)
Pt LS course thru-out.  Pt co some shortness of breath, paged RT for breathing tx.

## 2013-05-11 NOTE — Progress Notes (Deleted)
Paged 1 st contact after 5 pm number [phone]

## 2013-05-11 NOTE — Care Management Note (Signed)
Per MD request, benefits check with pt insurance informed this CM that pt insurance will cover generic Levoquin and copay is $20 for 31 day supply, so copay for 3 days should be very reasonable.  Johny Shockheryl Enola Siebers RN MPH, case manager, [phone]

## 2013-05-11 NOTE — Progress Notes (Signed)
UR Completed.  Janathan Bribiesca Jane Leighanna Kirn [phone] 05/11/2013  

## 2013-05-11 NOTE — Progress Notes (Signed)
Dr. Mikey BussingHoffman returned page, advised pt had 9 beat run v-tach, asymptomatic.  No new orders at this time.

## 2013-05-11 NOTE — Progress Notes (Signed)
Pt went back to SR 74

## 2013-05-11 NOTE — Progress Notes (Signed)
Paged [phone] 1st contact after 5 pm

## 2013-05-11 NOTE — H&P (Signed)
  Date: 05/11/2013  Patient name: Joseph Ellis  Medical record number: [id number]  Date of birth: 02/27/1960   I have seen and evaluated Joseph Ellis and discussed their care with the Residency Team. Joseph Ellis has no PMHx other than tobacco use - 1 PPD for about 30 yrs. 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 He has had slight DOE when walking one flight of stairs in the recent past. He has had two days of increasing cough, productive of sputum and dyspnea. He was admitted with ARF 2/2 possible COPD and possible CAP.   Overnight, he feels a bit better - the weight is off his chest but he i still struggling to breathe. No CP, no anorexia.   On exam, he remains hypoxic and is requiring supplemental O2 by [place]. He appears slightly uncomfortable with increased WOB. He is using accessory muscles. But able to speak in full sentences. His cardiac sounds are displaced inferiorly. He has diminished but decent air flow. slight end exp wheezes but not overt.  Assessment and Plan: I have seen and evaluated the patient as outlined above. I agree with the formulated Assessment and Plan as detailed in the residents' admission note, with the following changes:   1. Acute Respiratory Failure - Likely 2/2 CAP with possible COPD exac. Agree with current tx - narrow ABX to levaquin, cont sch nebs, O2. We discussed steroids - got lg IV dose in ER. Has decent enough air flow that I would hear wheezing but not present. So, no additional steroids and follow response and exam. Will need F/U CXR and PFT's as outpt.   2. Tobacco use - we discussed that he is now an ex-smoker. He thinks with another day or two in H and not smoking, he can quit. But brothers and other roommate smoke so I discussed it needs to be a group effort.   Burns SpainElizabeth A Dewitte Vannice, MD 4/29/20151:35 PM

## 2013-05-12 LAB — CBC WITH DIFFERENTIAL/PLATELET
BASOS ABS: 0 10*3/uL (ref 0.0–0.1)
BASOS PCT: 0 % (ref 0–1)
EOS PCT: 0 % (ref 0–5)
Eosinophils Absolute: 0 10*3/uL (ref 0.0–0.7)
HCT: 40.7 % (ref 39.0–52.0)
Hemoglobin: 13.1 g/dL (ref 13.0–17.0)
Lymphocytes Relative: 8 % — ABNORMAL LOW (ref 12–46)
Lymphs Abs: 1 10*3/uL (ref 0.7–4.0)
MCH: 29.4 pg (ref 26.0–34.0)
MCHC: 32.2 g/dL (ref 30.0–36.0)
MCV: 91.3 fL (ref 78.0–100.0)
Monocytes Absolute: 1 10*3/uL (ref 0.1–1.0)
Monocytes Relative: 8 % (ref 3–12)
NEUTROS ABS: 10.2 10*3/uL — AB (ref 1.7–7.7)
Neutrophils Relative %: 84 % — ABNORMAL HIGH (ref 43–77)
PLATELETS: 240 10*3/uL (ref 150–400)
RBC: 4.46 MIL/uL (ref 4.22–5.81)
RDW: 14.3 % (ref 11.5–15.5)
WBC: 12.2 10*3/uL — ABNORMAL HIGH (ref 4.0–10.5)

## 2013-05-12 MED ORDER — DM-GUAIFENESIN ER 30-600 MG PO TB12
1.0000 | ORAL_TABLET | Freq: Two times a day (BID) | ORAL | Status: DC
  Administered 2013-05-12: 1 via ORAL
  Filled 2013-05-12 (×2): qty 1

## 2013-05-12 MED ORDER — DM-GUAIFENESIN ER 30-600 MG PO TB12: 1.0000 | ORAL_TABLET | Freq: Two times a day (BID) | ORAL | Status: DC

## 2013-05-12 MED ORDER — TIOTROPIUM BROMIDE MONOHYDRATE 18 MCG IN CAPS: 18.0000 ug | ORAL_CAPSULE | Freq: Every day | RESPIRATORY_TRACT | Status: DC

## 2013-05-12 MED ORDER — LEVOFLOXACIN 750 MG PO TABS: 750.0000 mg | ORAL_TABLET | Freq: Every day | ORAL | Status: DC

## 2013-05-12 MED ORDER — ALBUTEROL SULFATE HFA 108 (90 BASE) MCG/ACT IN AERS
2.0000 | INHALATION_SPRAY | Freq: Four times a day (QID) | RESPIRATORY_TRACT | Status: DC | PRN
Start: 2013-05-12 — End: 2018-03-28

## 2013-05-12 NOTE — Discharge Instructions (Signed)
Smoking Cessation Quitting smoking is important to your health and has many advantages. However, it is not always easy to quit since nicotine is a very addictive drug. Often times, people try 3 times or more before being able to quit. This document explains the best ways for you to prepare to quit smoking. Quitting takes hard work and a lot of effort, but you can do it. ADVANTAGES OF QUITTING SMOKING  You will live longer, feel better, and live better.  Your body will feel the impact of quitting smoking almost immediately.  Within 20 minutes, blood pressure decreases. Your pulse returns to its normal level.  After 8 hours, carbon monoxide levels in the blood return to normal. Your oxygen level increases.  After 24 hours, the chance of having a heart attack starts to decrease. Your breath, hair, and body stop smelling like smoke.  After 48 hours, damaged nerve endings begin to recover. Your sense of taste and smell improve.  After 72 hours, the body is virtually free of nicotine. Your bronchial tubes relax and breathing becomes easier.  After 2 to 12 weeks, lungs can hold more air. Exercise becomes easier and circulation improves.  The risk of having a heart attack, stroke, cancer, or lung disease is greatly reduced.  After 1 year, the risk of coronary heart disease is cut in half.  After 5 years, the risk of stroke falls to the same as a nonsmoker.  After 10 years, the risk of lung cancer is cut in half and the risk of other cancers decreases significantly.  After 15 years, the risk of coronary heart disease drops, usually to the level of a nonsmoker.  If you are pregnant, quitting smoking will improve your chances of having a healthy baby.  The people you live with, especially any children, will be healthier.  You will have extra money to spend on things other than cigarettes. QUESTIONS TO THINK ABOUT BEFORE ATTEMPTING TO QUIT You may want to talk about your answers with your  caregiver.  Why do you want to quit?  If you tried to quit in the past, what helped and what did not?  What will be the most difficult situations for you after you quit? How will you plan to handle them?  Who can help you through the tough times? Your family? Friends? A caregiver?  What pleasures do you get from smoking? What ways can you still get pleasure if you quit? Here are some questions to ask your caregiver:  How can you help me to be successful at quitting?  What medicine do you think would be best for me and how should I take it?  What should I do if I need more help?  What is smoking withdrawal like? How can I get information on withdrawal? GET READY  Set a quit date.  Change your environment by getting rid of all cigarettes, ashtrays, matches, and lighters in your home, car, or work. Do not let people smoke in your home.  Review your past attempts to quit. Think about what worked and what did not. GET SUPPORT AND ENCOURAGEMENT You have a better chance of being successful if you have help. You can get support in many ways.  Tell your family, friends, and co-workers that you are going to quit and need their support. Ask them not to smoke around you.  Get individual, group, or telephone counseling and support. Programs are available at [employer] and health centers. Call your local health department for  information about programs in your area.  Spiritual beliefs and practices may help some smokers quit.  Download a "quit meter" on your computer to keep track of quit statistics, such as how long you have gone without smoking, cigarettes not smoked, and money saved.  Get a self-help book about quitting smoking and staying off of tobacco. LEARN NEW SKILLS AND BEHAVIORS  Distract yourself from urges to smoke. Talk to someone, go for a walk, or occupy your time with a task.  Change your normal routine. Take a different route to work. Drink tea instead of coffee.  Eat breakfast in a different place.  Reduce your stress. Take a hot bath, exercise, or read a book.  Plan something enjoyable to do every day. Reward yourself for not smoking.  Explore interactive web-based programs that specialize in helping you quit. GET MEDICINE AND USE IT CORRECTLY Medicines can help you stop smoking and decrease the urge to smoke. Combining medicine with the above behavioral methods and support can greatly increase your chances of successfully quitting smoking.  Nicotine replacement therapy helps deliver nicotine to your body without the negative effects and risks of smoking. Nicotine replacement therapy includes nicotine gum, lozenges, inhalers, nasal sprays, and skin patches. Some may be available over-the-counter and others require a prescription.  Antidepressant medicine helps people abstain from smoking, but how this works is unknown. This medicine is available by prescription.  Nicotinic receptor partial agonist medicine simulates the effect of nicotine in your brain. This medicine is available by prescription. Ask your caregiver for advice about which medicines to use and how to use them based on your health history. Your caregiver will tell you what side effects to look out for if you choose to be on a medicine or therapy. Carefully read the information on the package. Do not use any other product containing nicotine while using a nicotine replacement product.  RELAPSE OR DIFFICULT SITUATIONS Most relapses occur within the first 3 months after quitting. Do not be discouraged if you start smoking again. Remember, most people try several times before finally quitting. You may have symptoms of withdrawal because your body is used to nicotine. You may crave cigarettes, be irritable, feel very hungry, cough often, get headaches, or have difficulty concentrating. The withdrawal symptoms are only temporary. They are strongest when you first quit, but they will go away within  10 14 days. To reduce the chances of relapse, try to:  Avoid drinking alcohol. Drinking lowers your chances of successfully quitting.  Reduce the amount of caffeine you consume. Once you quit smoking, the amount of caffeine in your body increases and can give you symptoms, such as a rapid heartbeat, sweating, and anxiety.  Avoid smokers because they can make you want to smoke.  Do not let weight gain distract you. Many smokers will gain weight when they quit, usually less than 10 pounds. Eat a healthy diet and stay active. You can always lose the weight gained after you quit.  Find ways to improve your mood other than smoking. FOR MORE INFORMATION  www.smokefree.gov  Document Released: 12/24/2000 Document Revised: 07/01/2011 Document Reviewed: 04/10/2011 [facility] Patient Information 2014 [place].   Chronic Obstructive Pulmonary Disease Chronic obstructive pulmonary disease (COPD) is a common lung condition in which airflow from the lungs is limited. COPD is a general term that can be used to describe many different lung problems that limit airflow, including both chronic bronchitis and emphysema. If you have COPD, your lung function will probably never  return to normal, but there are measures you can take to improve lung function and make yourself feel better.  CAUSES   Smoking (common).   Exposure to secondhand smoke.   Genetic problems.  Chronic inflammatory lung diseases or recurrent infections. SYMPTOMS   Shortness of breath, especially with physical activity.   Deep, persistent (chronic) cough with a large amount of thick mucus.   Wheezing.   Rapid breaths (tachypnea).   Gray or bluish discoloration (cyanosis) of the skin, especially in fingers, toes, or lips.   Fatigue.   Weight loss.   Frequent infections or episodes when breathing symptoms become much worse (exacerbations).   Chest tightness. DIAGNOSIS  Your healthcare provider will take a  medical history and perform a physical examination to make the initial diagnosis. Additional tests for COPD may include:   Lung (pulmonary) function tests.  Chest X-ray.  CT scan.  Blood tests. TREATMENT  Treatment available to help you feel better when you have COPD include:   Inhaler and nebulizer medicines. These help manage the symptoms of COPD and make your breathing more comfortable  Supplemental oxygen. Supplemental oxygen is only helpful if you have a low oxygen level in your blood.   Exercise and physical activity. These are beneficial for nearly all people with COPD. Some people may also benefit from a pulmonary rehabilitation program. HOME CARE INSTRUCTIONS   Take all medicines (inhaled or pills) as directed by your health care provider.  Only take over-the-counter or prescription medicines for pain, fever, or discomfort as directed by your health care provider.   Avoid over-the-counter medicines or cough syrups that dry up your airway (such as antihistamines) and slow down the elimination of secretions unless instructed otherwise by your healthcare provider.   If you are a smoker, the most important thing that you can do is stop smoking. Continuing to smoke will cause further lung damage and breathing trouble. Ask your health care provider for help with quitting smoking. He or she can direct you to community resources or hospitals that provide support.  Avoid exposure to irritants such as smoke, chemicals, and fumes that aggravate your breathing.  Use oxygen therapy and pulmonary rehabilitation if directed by your health care provider. If you require home oxygen therapy, ask your healthcare provider whether you should purchase a pulse oximeter to measure your oxygen level at home.   Avoid contact with individuals who have a contagious illness.  Avoid extreme temperature and humidity changes.  Eat healthy foods. Eating smaller, more frequent meals and resting before  meals may help you maintain your strength.  Stay active, but balance activity with periods of rest. Exercise and physical activity will help you maintain your ability to do things you want to do.  Preventing infection and hospitalization is very important when you have COPD. Make sure to receive all the vaccines your health care provider recommends, especially the pneumococcal and influenza vaccines. Ask your healthcare provider whether you need a pneumonia vaccine.  Learn and use relaxation techniques to manage stress.  Learn and use controlled breathing techniques as directed by your health care provider. Controlled breathing techniques include:   Pursed lip breathing. Start by breathing in (inhaling) through your nose for 1 second. Then, purse your lips as if you were going to whistle and breathe out (exhale) through the pursed lips for 2 seconds.   Diaphragmatic breathing. Start by putting one hand on your abdomen just above your waist. Inhale slowly through your nose. The hand on your  abdomen should move out. Then purse your lips and exhale slowly. You should be able to feel the hand on your abdomen moving in as you exhale.   Learn and use controlled coughing to clear mucus from your lungs. Controlled coughing is a series of short, progressive coughs. The steps of controlled coughing are:  1. Lean your head slightly forward.  2. Breathe in deeply using diaphragmatic breathing.  3. Try to hold your breath for 3 seconds.  4. Keep your mouth slightly open while coughing twice.  5. Spit any mucus out into a tissue.  6. Rest and repeat the steps once or twice as needed. SEEK MEDICAL CARE IF:   You are coughing up more mucus than usual.   There is a change in the color or thickness of your mucus.   Your breathing is more labored than usual.   Your breathing is faster than usual.  SEEK IMMEDIATE MEDICAL CARE IF:   You have shortness of breath while you are resting.   You  have shortness of breath that prevents you from:  Being able to talk.   Performing your usual physical activities.   You have chest pain lasting longer than 5 minutes.   Your skin color is more cyanotic than usual.  You measure low oxygen saturations for longer than 5 minutes with a pulse oximeter. MAKE SURE YOU:   Understand these instructions.  Will watch your condition.  Will get help right away if you are not doing well or get worse. Document Released: 10/09/2004 Document Revised: 10/20/2012 Document Reviewed: 08/26/2012 [facility] Patient Information 2014 [place], [place].   Pneumonia, Adult Pneumonia is an infection of the lungs.  CAUSES Pneumonia may be caused by bacteria or a virus. Usually, these infections are caused by breathing infectious particles into the lungs (respiratory tract). SYMPTOMS   Cough.  Fever.  Chest pain.  Increased rate of breathing.  Wheezing.  Mucus production. DIAGNOSIS  If you have the common symptoms of pneumonia, your caregiver will typically confirm the diagnosis with a chest X-ray. The X-ray will show an abnormality in the lung (pulmonary infiltrate) if you have pneumonia. Other tests of your blood, urine, or sputum may be done to find the specific cause of your pneumonia. Your caregiver may also do tests (blood gases or pulse oximetry) to see how well your lungs are working. TREATMENT  Some forms of pneumonia may be spread to other people when you cough or sneeze. You may be asked to wear a mask before and during your exam. Pneumonia that is caused by bacteria is treated with antibiotic medicine. Pneumonia that is caused by the influenza virus may be treated with an antiviral medicine. Most other viral infections must run their course. These infections will not respond to antibiotics.  PREVENTION A pneumococcal shot (vaccine) is available to prevent a common bacterial cause of pneumonia. This is usually suggested for:  People over  65 years old.  Patients on chemotherapy.  People with chronic lung problems, such as bronchitis or emphysema.  People with immune system problems. If you are over 65 or have a high risk condition, you may receive the pneumococcal vaccine if you have not received it before. In some countries, a routine influenza vaccine is also recommended. This vaccine can help prevent some cases of pneumonia.You may be offered the influenza vaccine as part of your care. If you smoke, it is time to quit. You may receive instructions on how to stop smoking. Your caregiver can provide  medicines and counseling to help you quit. HOME CARE INSTRUCTIONS   Cough suppressants may be used if you are losing too much rest. However, coughing protects you by clearing your lungs. You should avoid using cough suppressants if you can.  Your caregiver may have prescribed medicine if he or she thinks your pneumonia is caused by a bacteria or influenza. Finish your medicine even if you start to feel better.  Your caregiver may also prescribe an expectorant. This loosens the mucus to be coughed up.  Only take over-the-counter or prescription medicines for pain, discomfort, or fever as directed by your caregiver.  Do not smoke. Smoking is a common cause of bronchitis and can contribute to pneumonia. If you are a smoker and continue to smoke, your cough may last several weeks after your pneumonia has cleared.  A cold steam vaporizer or humidifier in your room or home may help loosen mucus.  Coughing is often worse at night. Sleeping in a semi-upright position in a recliner or using a couple pillows under your head will help with this.  Get rest as you feel it is needed. Your body will usually let you know when you need to rest. SEEK IMMEDIATE MEDICAL CARE IF:   Your illness becomes worse. This is especially true if you are elderly or weakened from any other disease.  You cannot control your cough with suppressants and are  losing sleep.  You begin coughing up blood.  You develop pain which is getting worse or is uncontrolled with medicines.  You have a fever.  Any of the symptoms which initially brought you in for treatment are getting worse rather than better.  You develop shortness of breath or chest pain. MAKE SURE YOU:   Understand these instructions.  Will watch your condition.  Will get help right away if you are not doing well or get worse. Document Released: 12/30/2004 Document Revised: 03/24/2011 Document Reviewed: 03/21/2010 [facility] Patient Information 2014 [place], [place].

## 2013-05-12 NOTE — Progress Notes (Signed)
WALKING OXYGEN SATURATION :   Patient Saturations on Room Air at Rest = 93%  Patient Saturations on Room Air while Ambulating = 92-95%   Patient denies shortness of breath or lightheadedness during ambulation.  Respirations 20-24.

## 2013-05-12 NOTE — Care Management Note (Signed)
CARE MANAGEMENT NOTE 05/12/2013  Patient:  Joseph Ellis,Joseph Ellis   Account Number:  [id number]  Date Initiated:  05/11/2013  Documentation initiated by:  Azaryah Heathcock  Subjective/Objective Assessment:   CM consult to check on pt copay for Levoquin     Action/Plan:   CMA able to make inquiry and pt copay for generic Levoquin is $20 for 31 days and as pt only has 3 more days of this medication, his copay should be minimal. Late in day request for copays for inhalers, this info is pending.   Anticipated DC Date:  05/12/2013   Anticipated DC Plan:  HOME/SELF CARE         Choice offered to / List presented to:             Status of service:  Completed, signed off Medicare Important Message given?   (If response is "NO", the following Medicare IM given date fields will be blank) Date Medicare IM given:   Date Additional Medicare IM given:    Discharge Disposition:  HOME/SELF CARE  Per UR Regulation:    If discussed at Long Length of Stay Meetings, dates discussed:    Comments:  05/11/2013 Benefit check complete. CRoyal RN MPH, case manager, [phone]

## 2013-05-12 NOTE — Progress Notes (Signed)
Patient discharged.  Educated on discharge instructions, follow-up appointments, and discharge medications.  Prescriptions sent to [facility].  Patient educated on smoking cessation and pneumonia, signs and symptoms, and when to call a doctor.  Patient verbalized understanding, AVS signed.  IV removed.  Telemetry box 25 removed and returned, CCMD notified.

## 2013-05-12 NOTE — Care Management Note (Signed)
CARE MANAGEMENT NOTE 05/12/2013  Patient:  Joseph Ellis,Joseph Ellis   Account Number:  [id number]  Date Initiated:  05/11/2013  Documentation initiated by:  Kristopher Attwood  Subjective/Objective Assessment:   CM consult to check on pt copay for Levoquin     Action/Plan:   CMA able to make inquiry and pt copay for generic Levoquin is $20 for 31 days and as pt only has 3 more days of this medication, his copay should be minimal. Late in day request for copays for inhalers, this info is pending.   Anticipated DC Date:     Anticipated DC Plan:  HOME/SELF CARE         Choice offered to / List presented to:             Status of service:  In process, will continue to follow Medicare Important Message given?   (If response is "NO", the following Medicare IM given date fields will be blank) Date Medicare IM given:   Date Additional Medicare IM given:    Discharge Disposition:    Per UR Regulation:    If discussed at Long Length of Stay Meetings, dates discussed:    Comments:

## 2013-05-12 NOTE — Progress Notes (Signed)
Subjective: Patient continues to improve. States breathing almost back to baseline. Gets somewhat SOB with exertion. Cough is improving, though seems to be worse immediately after his duoneb treatments. No abd pain, N/V/D. Chest somewhat heavy, but notes this is much improved compared to admission. Pt did have 9 beat run of asymptomatic VTach overnight.   Objective: Vital signs in last 24 hours: Filed Vitals:   05/12/13 0445 05/12/13 0857 05/12/13 0919 05/12/13 1004  BP: 104/46   110/86  Pulse: 67   94  Temp: 98.3 F (36.8 C)   97.7 F (36.5 C)  TempSrc: Oral   Oral  Resp: 20   24  Height:      Weight:      SpO2: 98% 92% 93% 91%   Weight change: 2 lb 1.6 oz (0.953 kg)  Intake/Output Summary (Last 24 hours) at 05/12/13 1048 Last data filed at 05/11/13 1853  Gross per 24 hour  Intake    600 ml  Output      4 ml  Net    596 ml   Physical Exam: General: alert, cooperative, breathing comfortably in bed HEENT: vision grossly intact, oropharynx clear and non-erythematous  Neck: supple Lungs: normal WOB, lungs CTAB Heart: RRR Abdomen: soft, very thin, non-tender, non-distended, normal bowel sounds  Extremities: no pedal edema Neurologic: alert & oriented X3, cranial nerves II-XII grossly intact, moves all extremities spontaneously   Lab Results: Basic Metabolic Panel:  Recent Labs Lab 05/10/13 1145 05/11/13 0422  NA 138 142  K 4.1 4.2  CL 100 104  CO2 23 24  GLUCOSE 103* 109*  BUN 9 13  CREATININE 0.66 0.74  CALCIUM 9.5 9.2   CBC:  Recent Labs Lab 05/10/13 1145 05/11/13 0422 05/12/13 0755  WBC 20.0* 17.5* 12.2*  NEUTROABS 17.4*  --  10.2*  HGB 16.0 13.0 13.1  HCT 46.6 38.7* 40.7  MCV 88.8 88.6 91.3  PLT 255 224 240   Cardiac Enzymes:  Recent Labs Lab 05/10/13 2008 05/11/13 0422 05/11/13 0810  TROPONINI <0.30 <0.30 <0.30   Micro Results: Recent Results (from the past 240 hour(s))  CULTURE, BLOOD (ROUTINE X 2)     Status: None   Collection Time     05/10/13  5:12 PM      Result Value Ref Range Status   Specimen Description BLOOD LEFT ARM   Final   Special Requests BOTTLES DRAWN AEROBIC AND ANAEROBIC 10CC   Final   Culture  Setup Time     Final   Value: 05/10/2013 22:25     [facility] at [facility]   Culture     Final   Value:        BLOOD CULTURE RECEIVED NO GROWTH TO DATE CULTURE WILL BE HELD FOR 5 DAYS BEFORE ISSUING A FINAL NEGATIVE REPORT     Performed at [facility]   Report Status PENDING   Incomplete  CULTURE, BLOOD (ROUTINE X 2)     Status: None   Collection Time    05/10/13  5:16 PM      Result Value Ref Range Status   Specimen Description BLOOD LEFT FOREARM   Final   Special Requests     Final   Value: BOTTLES DRAWN AEROBIC AND ANAEROBIC 10CC AER,9CC ANA   Culture  Setup Time     Final   Value: 05/10/2013 22:25     Performed at [facility]   Culture     Final   Value:  BLOOD CULTURE RECEIVED NO GROWTH TO DATE CULTURE WILL BE HELD FOR 5 DAYS BEFORE ISSUING A FINAL NEGATIVE REPORT     Performed at [facility]   Report Status PENDING   Incomplete   Studies/Results: Dg Chest 2 View  05/11/2013   CLINICAL DATA:  Fever, cough and shortness of breath. History of smoking.  EXAM: CHEST  2 VIEW  COMPARISON:  Chest radiograph performed earlier today 11:45 a.m.  FINDINGS: Focal nodular opacity at the left lung base could reflect mild infection, or possibly the left nipple shadow, better characterized than on the prior study. No pleural effusion or pneumothorax is seen. The lungs are hyperexpanded, with flattening of the hemidiaphragms, compatible with COPD. Mild scarring is again noted at the left lung apex.  The heart remains normal in size. No acute osseous abnormalities are identified.  IMPRESSION: 1. Focal nodular opacity at the left lung base is more prominent than on the recent prior study and may reflect mild infection, or possibly the left nipple shadow. A follow-up chest  radiograph with nipple markers could be considered for further evaluation. Alternatively, if the patient has symptoms compatible with pneumonia, would consider treating for pneumonia, and performing follow-up chest radiograph after completion of treatment, to ensure resolution of airspace opacity. 2. Findings of COPD.   Electronically Signed   By: Roanna RaiderJeffery  Chang M.D.   On: 05/11/2013 03:36   Dg Chest Port 1 View  05/10/2013   CLINICAL DATA:  Chest pain.  EXAM: PORTABLE CHEST - 1 VIEW  COMPARISON:  None.  FINDINGS: Left apical pleural parenchymal thickening with slight nodularity is noted. Although this is most likely related to scarring a left apical lung cancer cannot be excluded. Chest CT can be obtained on a nonemergent basis for further evaluation. Bibasilar subsegmental atelectasis and or mild infiltrates noted. COPD. Mediastinum hilar structures normal. Heart size normal. No focal bony abnormality.  IMPRESSION: 1. Nodular left apical pleural parenchymal thickening. This is most likely related to scarring however tumor cannot be excluded and nonemergent contrast enhanced chest CT suggested for further evaluation . 2. Mild bibasilar subsegmental atelectasis and/or infiltrates. 3. COPD.   Electronically Signed   By: Maisie Fushomas  Register   On: 05/10/2013 12:12   Medications: I have reviewed the patient's current medications. Scheduled Meds: . aspirin EC  81 mg Oral Daily  . dextromethorphan-guaiFENesin  1 tablet Oral BID  . enoxaparin (LOVENOX) injection  40 mg Subcutaneous Q24H  . ipratropium-albuterol  3 mL Nebulization Q6H  . levofloxacin  750 mg Oral Daily  . sodium chloride  3 mL Intravenous Q12H   Continuous Infusions:  PRN Meds:.  Assessment/Plan:  # Acute respiratory failure 2/2 CAP: Continues to improve. Leukocytosis continues to down trend (12.2 today). Patient's oxygen saturation 93% on room air at rest and 92-95% on room air with ambulation. Pt given [facility].  Patient feels ready to go home today.  -levaquin 750mg  PO (day 3/5)- will discharge with 2 more pills -duonebs q4h scheduled -discharge with spiriva and albuterol inhaler - mucinex for cough, will continue at discharge   #CXR abnormality: Patient with nodular L apical pleural thickening noted on CXR. This is somewhat concerning for malignancy as patient with long hx of tobacco abuse.  -will need outpatient follow up and monitoring of this abnormality   # VTE: lovenox   # Diet: regular   Code status: Full   Dispo: Discharge today.  The patient does not have a current PCP (  No primary provider on file.) and does need an [facility] hospital follow-up appointment after discharge.  The patient does not have transportation limitations that hinder transportation to clinic appointments.  .Services Needed at time of discharge: Y = Yes, Blank = No PT:   OT:   RN:   Equipment:   Other:     LOS: 2 days   Windell Hummingbird, MD 05/12/2013, 10:48 AM

## 2013-05-16 LAB — CULTURE, BLOOD (ROUTINE X 2)
CULTURE: NO GROWTH
Culture: NO GROWTH

## 2013-05-20 ENCOUNTER — Ambulatory Visit (INDEPENDENT_AMBULATORY_CARE_PROVIDER_SITE_OTHER): Payer: No Typology Code available for payment source | Admitting: Internal Medicine

## 2013-05-20 ENCOUNTER — Encounter: Payer: Self-pay | Admitting: Internal Medicine

## 2013-05-20 VITALS — BP 112/61 | HR 77 | Temp 97.8°F | Wt 128.3 lb

## 2013-05-20 DIAGNOSIS — J449 Chronic obstructive pulmonary disease, unspecified: Secondary | ICD-10-CM

## 2013-05-20 DIAGNOSIS — F17200 Nicotine dependence, unspecified, uncomplicated: Secondary | ICD-10-CM

## 2013-05-20 DIAGNOSIS — J189 Pneumonia, unspecified organism: Secondary | ICD-10-CM

## 2013-05-20 DIAGNOSIS — Z720 Tobacco use: Secondary | ICD-10-CM

## 2013-05-20 NOTE — Patient Instructions (Addendum)
General Instructions: Your pneumonia seems to have resolved.  To make sure you have fully healed, we are checking a chest x-ray for follow-up. -[facility] sometime during the week of June 1st-5th (walk-in, first come first served)  Counselling psychologistCongratulations on quitting smoking!  Quitting smoking is the best thing you can do for your health.  If you are having trouble quitting on your own, we can discuss products like nicotine patches to help you quit.  We would like to test you for COPD (see information below), with something called a Pulmonary Function Test.  We will schedule this for about 1 month from now.  Please return for a follow-up visit after completing your X-ray and Pulmonary Function tests, in about 6-8 weeks.   Treatment Goals:  Goals (1 Years of Data) as of 05/20/13   None      Progress Toward Treatment Goals:  Treatment Goal 05/20/2013  Stop smoking smoking less    Self Care Goals & Plans:  No flowsheet data found.  No flowsheet data found.   Care Management & Community Referrals:  Referral 05/20/2013  Referrals made for care management support none needed      Chronic Obstructive Pulmonary Disease Chronic obstructive pulmonary disease (COPD) is a common lung condition in which airflow from the lungs is limited. COPD is a general term that can be used to describe many different lung problems that limit airflow, including both chronic bronchitis and emphysema. If you have COPD, your lung function will probably never return to normal, but there are measures you can take to improve lung function and make yourself feel better.  CAUSES   Smoking (common).   Exposure to secondhand smoke.   Genetic problems.  Chronic inflammatory lung diseases or recurrent infections. SYMPTOMS   Shortness of breath, especially with physical activity.   Deep, persistent (chronic) cough with a large amount of thick mucus.   Wheezing.    Rapid breaths (tachypnea).   Gray or bluish discoloration (cyanosis) of the skin, especially in fingers, toes, or lips.   Fatigue.   Weight loss.   Frequent infections or episodes when breathing symptoms become much worse (exacerbations).   Chest tightness. DIAGNOSIS  Your healthcare provider will take a medical history and perform a physical examination to make the initial diagnosis. Additional tests for COPD may include:   Lung (pulmonary) function tests.  Chest X-ray.  CT scan.  Blood tests. TREATMENT  Treatment available to help you feel better when you have COPD include:   Inhaler and nebulizer medicines. These help manage the symptoms of COPD and make your breathing more comfortable  Supplemental oxygen. Supplemental oxygen is only helpful if you have a low oxygen level in your blood.   Exercise and physical activity. These are beneficial for nearly all people with COPD. Some people may also benefit from a pulmonary rehabilitation program. HOME CARE INSTRUCTIONS   Take all medicines (inhaled or pills) as directed by your health care provider.  Only take over-the-counter or prescription medicines for pain, fever, or discomfort as directed by your health care provider.   Avoid over-the-counter medicines or cough syrups that dry up your airway (such as antihistamines) and slow down the elimination of secretions unless instructed otherwise by your healthcare provider.   If you are a smoker, the most important thing that you can do is stop smoking. Continuing to smoke will cause further lung damage and breathing trouble. Ask your health care provider  for help with quitting smoking. He or she can direct you to community resources or hospitals that provide support.  Avoid exposure to irritants such as smoke, chemicals, and fumes that aggravate your breathing.  Use oxygen therapy and pulmonary rehabilitation if directed by your health care provider. If you require  home oxygen therapy, ask your healthcare provider whether you should purchase a pulse oximeter to measure your oxygen level at home.   Avoid contact with individuals who have a contagious illness.  Avoid extreme temperature and humidity changes.  Eat healthy foods. Eating smaller, more frequent meals and resting before meals may help you maintain your strength.  Stay active, but balance activity with periods of rest. Exercise and physical activity will help you maintain your ability to do things you want to do.  Preventing infection and hospitalization is very important when you have COPD. Make sure to receive all the vaccines your health care provider recommends, especially the pneumococcal and influenza vaccines. Ask your healthcare provider whether you need a pneumonia vaccine.  Learn and use relaxation techniques to manage stress.  Learn and use controlled breathing techniques as directed by your health care provider. Controlled breathing techniques include:   Pursed lip breathing. Start by breathing in (inhaling) through your nose for 1 second. Then, purse your lips as if you were going to whistle and breathe out (exhale) through the pursed lips for 2 seconds.   Diaphragmatic breathing. Start by putting one hand on your abdomen just above your waist. Inhale slowly through your nose. The hand on your abdomen should move out. Then purse your lips and exhale slowly. You should be able to feel the hand on your abdomen moving in as you exhale.   Learn and use controlled coughing to clear mucus from your lungs. Controlled coughing is a series of short, progressive coughs. The steps of controlled coughing are:  1. Lean your head slightly forward.  2. Breathe in deeply using diaphragmatic breathing.  3. Try to hold your breath for 3 seconds.  4. Keep your mouth slightly open while coughing twice.  5. Spit any mucus out into a tissue.  6. Rest and repeat the steps once or twice as  needed. SEEK MEDICAL CARE IF:   You are coughing up more mucus than usual.   There is a change in the color or thickness of your mucus.   Your breathing is more labored than usual.   Your breathing is faster than usual.  SEEK IMMEDIATE MEDICAL CARE IF:   You have shortness of breath while you are resting.   You have shortness of breath that prevents you from:  Being able to talk.   Performing your usual physical activities.   You have chest pain lasting longer than 5 minutes.   Your skin color is more cyanotic than usual.  You measure low oxygen saturations for longer than 5 minutes with a pulse oximeter. MAKE SURE YOU:   Understand these instructions.  Will watch your condition.  Will get help right away if you are not doing well or get worse. Document Released: 10/09/2004 Document Revised: 10/20/2012 Document Reviewed: 08/26/2012 [facility] Patient Information 2014 [employer] [place].

## 2013-05-20 NOTE — Progress Notes (Signed)
Attending physician note: Presenting complaints, physical findings, and medications reviewed with resident physician Dr. Ryan Brown and I concur with his management. James Granfortuna, M.D., FACP 

## 2013-05-20 NOTE — Assessment & Plan Note (Signed)
  Assessment: Progress toward smoking cessation:  smoking less Barriers to progress toward smoking cessation:  withdrawal symptoms Comments: Pt is attempting to quit smoking cold [place].  He notes that he feels that he is doing a good job on his own, and doesn't want smoking cessation aid at this time.  Plan: Instruction/counseling given:  I counseled patient on the dangers of tobacco use, advised patient to stop smoking, and reviewed strategies to maximize success. Educational resources provided:    Self management tools provided:    Medications to assist with smoking cessation:  None Patient agreed to the following self-care plans for smoking cessation:    Other plans: Reassess at next visit

## 2013-05-20 NOTE — Assessment & Plan Note (Signed)
The patient has a history of tobacco abuse, and a presumptive diagnosis of COPD based on his presentation at last hospitalization. -ordered outpatient PFT's for about 1 month from now, once patient has fully recovered from PNA -pt has not filled spiriva.  Will take this off his med list for now, and can decide on treatment course following PFT results -pt to return for follow-up visit to discuss PFT and x-ray results in 6-8 weeks

## 2013-05-20 NOTE — Assessment & Plan Note (Signed)
The patient's CAP appears to have resolved.  Initial portable CXR in ED showed possible parenchymal thickening in apex of left lung, though follow-up 2-view CXR (better quality) did not comment on this. -check f/u CXR in about 4 weeks to ensure full resolution of CAP.  If parenchymal thickening is noted, consider follow-up with chest CT.

## 2013-05-20 NOTE — Progress Notes (Signed)
HPI The patient is a 53 y.o. male with a history of tobacco abuse, possible COPD, presenting for a hospital follow-up, and to establish care with our clinic.  The patient was hospitalized 4/28-30 with CAP, treated with ceftriaxone/azithro, then transitioned to levaquin.  He was also given a presumptive diagnosis of COPD, and started on Spiriva and albuterol.  The patient notes he feels like he is "getting better every day", now able to return to carpenter work.  He completed his course of levaquin.  He filled his prescription for albuterol, but notes not having to use this in the last few days.  The patient did not fill Spiriva, thinking that he did not need this since he was feeling better.  The patient has a history of tobacco abuse, smoking 1.5 ppd.  The patient has only had 4-5 cigarettes in the last 1 week, and states he is trying to quit.  He does note increase in appetite since cutting back.  ROS: General: no fevers, chills, changes in weight, changes in appetite Skin: no rash HEENT: no blurry vision, hearing changes, sore throat Pulm: no dyspnea, coughing, wheezing CV: no chest pain, palpitations, shortness of breath Abd: no abdominal pain, nausea/vomiting, diarrhea/constipation GU: no dysuria, hematuria, polyuria Ext: no arthralgias, myalgias Neuro: no weakness, numbness, or tingling  Filed Vitals:   05/20/13 1352  BP: 112/61  Pulse: 77  Temp: 97.8 F (36.6 C)    PEX General: alert, cooperative, and in no apparent distress HEENT: pupils equal round and reactive to light, vision grossly intact, oropharynx clear and non-erythematous  Neck: supple Lungs: clear to ascultation bilaterally, normal work of respiration, no wheezes, rales, ronchi Heart: regular rate and rhythm, no murmurs, gallops, or rubs Abdomen: soft, non-tender, non-distended, normal bowel sounds Extremities: no cyanosis, clubbing, or edema Neurologic: alert & oriented X3, cranial nerves II-XII intact, strength  grossly intact, sensation intact to light touch  Current Outpatient Prescriptions on File Prior to Visit  Medication Sig Dispense Refill  . albuterol (PROVENTIL HFA;VENTOLIN HFA) 108 (90 BASE) MCG/ACT inhaler Inhale 2 puffs into the lungs every 6 (six) hours as needed for wheezing or shortness of breath.  1 Inhaler  2  . dextromethorphan-guaiFENesin (MUCINEX DM) 30-600 MG per 12 hr tablet Take 1 tablet by mouth 2 (two) times daily.  20 tablet  0  . levofloxacin (LEVAQUIN) 750 MG tablet Take 1 tablet (750 mg total) by mouth daily.  2 tablet  0  . tiotropium (SPIRIVA HANDIHALER) 18 MCG inhalation capsule Place 1 capsule (18 mcg total) into inhaler and inhale daily.  30 capsule  1   No current facility-administered medications on file prior to visit.    Assessment/Plan

## 2013-06-20 ENCOUNTER — Inpatient Hospital Stay (HOSPITAL_COMMUNITY): Admission: RE | Admit: 2013-06-20 | Payer: No Typology Code available for payment source | Source: Ambulatory Visit

## 2015-06-25 IMAGING — CR DG CHEST 1V PORT
2 series · 2 of 2 positions shown · non-contrast
Comparison: None.

CLINICAL DATA: Chest pain.

EXAM:
PORTABLE CHEST - 1 VIEW

[AP (1 of 2)]
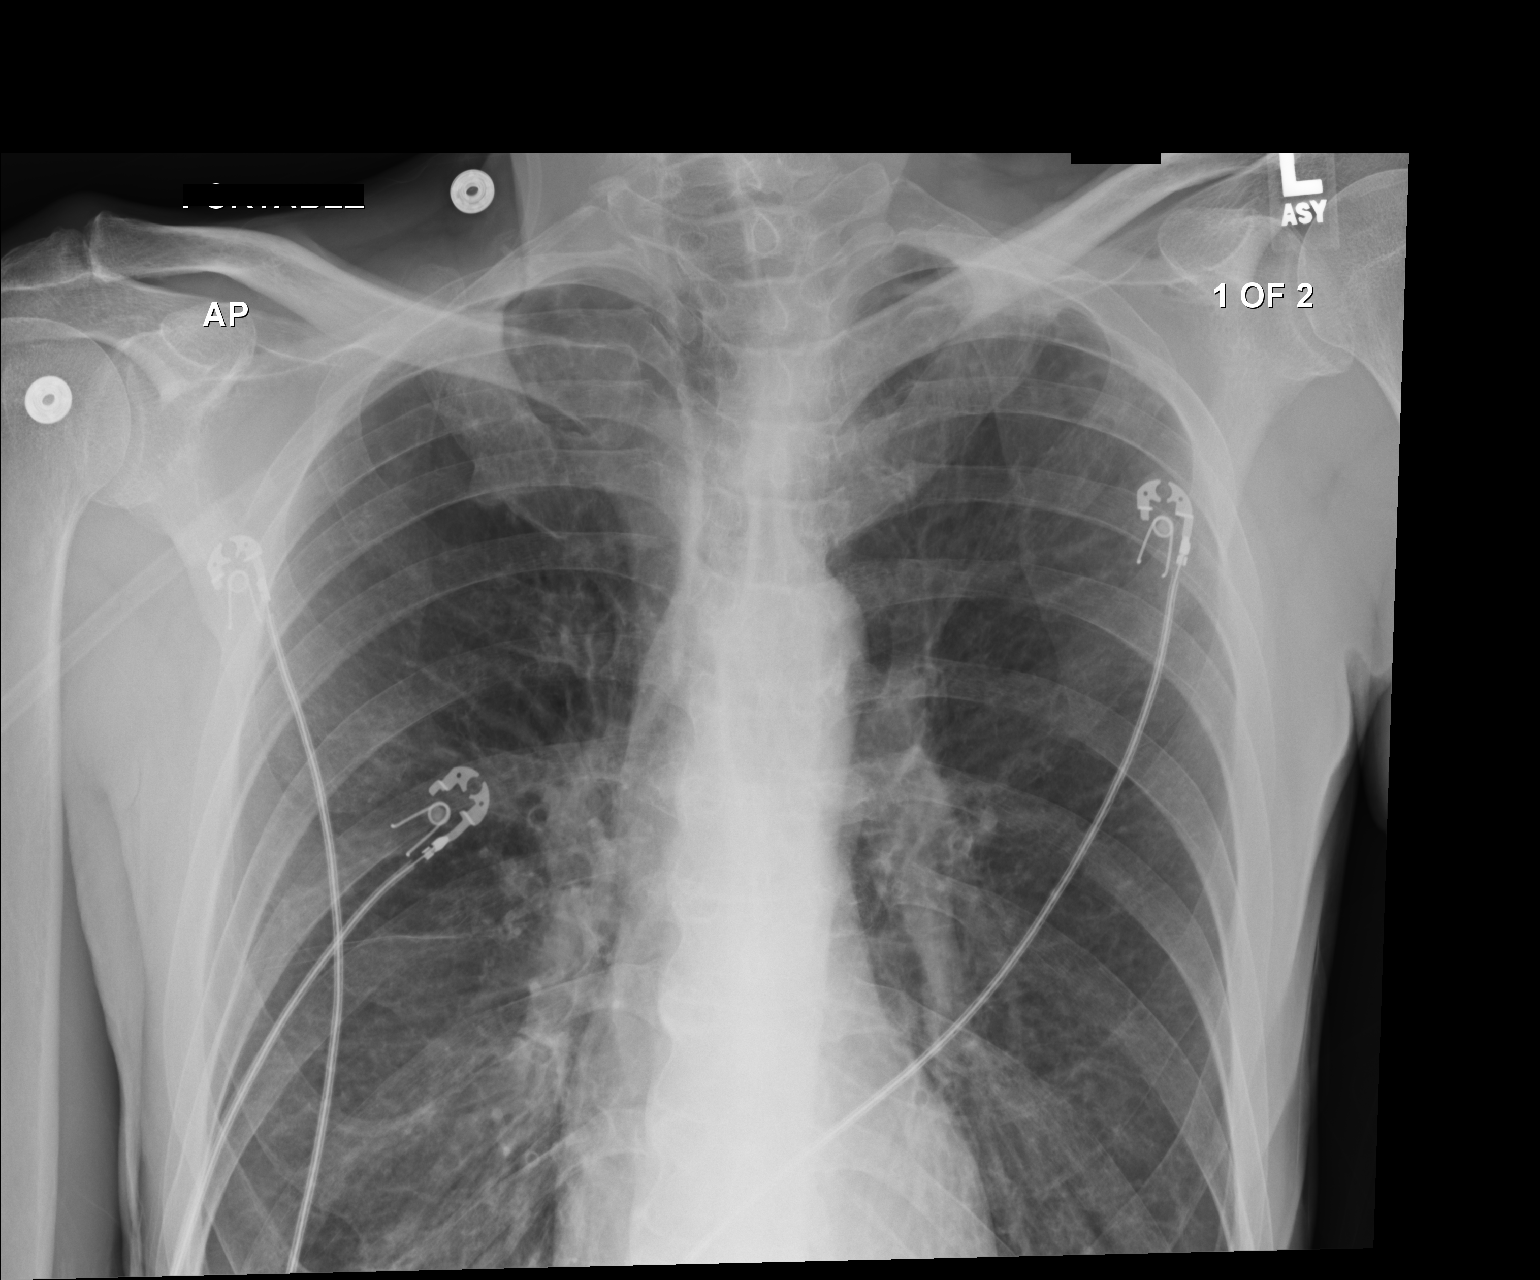

[AP (2 of 2)]
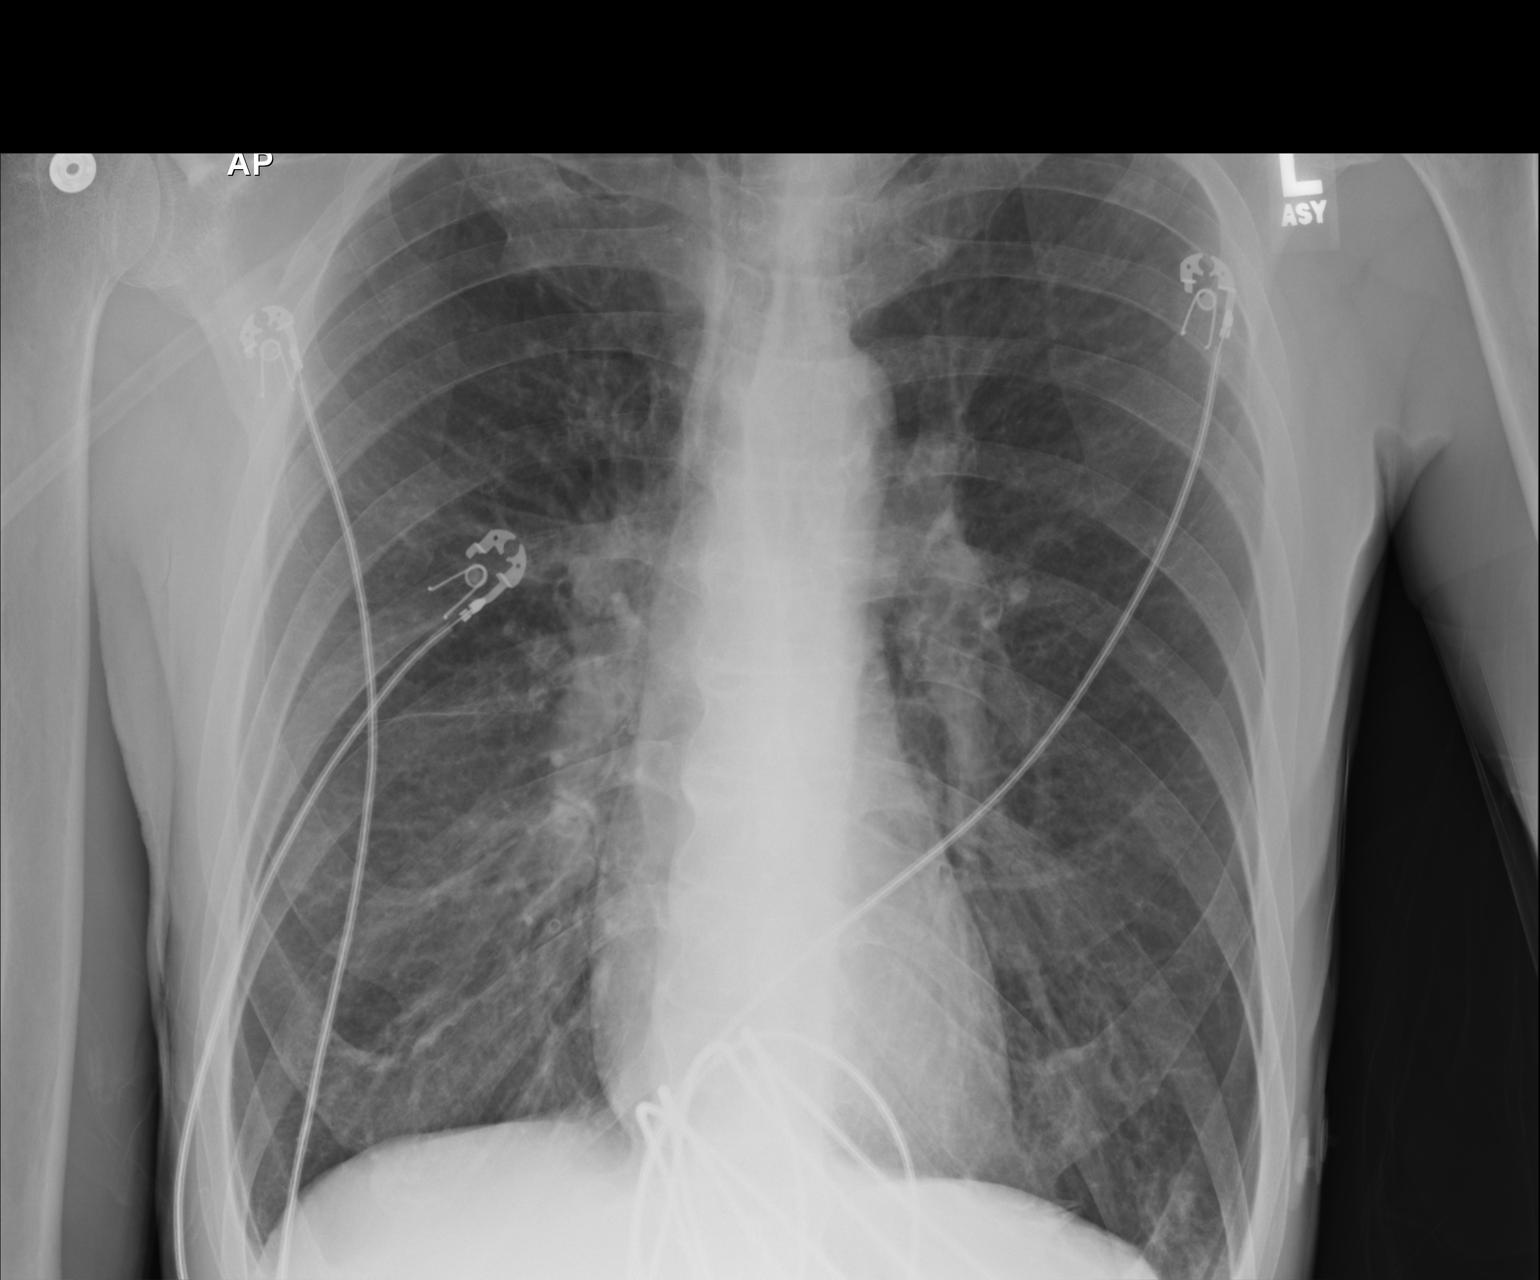

[2 of 2 positions shown; findings below may reference images not displayed]

FINDINGS: Left apical pleural parenchymal thickening with slight nodularity is
noted. Although this is most likely related to scarring a left
apical lung cancer cannot be excluded. Chest CT can be obtained on a
nonemergent basis for further evaluation. Bibasilar subsegmental
atelectasis and or mild infiltrates noted. COPD. Mediastinum hilar
structures normal. Heart size normal. No focal bony abnormality.
IMPRESSION: 1. Nodular left apical pleural parenchymal thickening. This is most
likely related to scarring however tumor cannot be excluded and
nonemergent contrast enhanced chest CT suggested for further
evaluation .
2. Mild bibasilar subsegmental atelectasis and/or infiltrates.
3. COPD.

## 2015-09-27 ENCOUNTER — Encounter (HOSPITAL_COMMUNITY): Payer: Self-pay | Admitting: Emergency Medicine

## 2015-09-27 ENCOUNTER — Emergency Department (HOSPITAL_COMMUNITY): Payer: No Typology Code available for payment source

## 2015-09-27 ENCOUNTER — Emergency Department (HOSPITAL_COMMUNITY)
Admission: EM | Admit: 2015-09-27 | Discharge: 2015-09-27 | Disposition: A | Discharge status: Home / Self Care | Payer: No Typology Code available for payment source | Attending: Emergency Medicine | Admitting: Emergency Medicine

## 2015-09-27 DIAGNOSIS — J449 Chronic obstructive pulmonary disease, unspecified: Secondary | ICD-10-CM | POA: Insufficient documentation

## 2015-09-27 DIAGNOSIS — S62102A Fracture of unspecified carpal bone, left wrist, initial encounter for closed fracture: Secondary | ICD-10-CM

## 2015-09-27 DIAGNOSIS — S52572A Other intraarticular fracture of lower end of left radius, initial encounter for closed fracture: Secondary | ICD-10-CM | POA: Insufficient documentation

## 2015-09-27 DIAGNOSIS — Y929 Unspecified place or not applicable: Secondary | ICD-10-CM | POA: Insufficient documentation

## 2015-09-27 DIAGNOSIS — X58XXXA Exposure to other specified factors, initial encounter: Secondary | ICD-10-CM | POA: Insufficient documentation

## 2015-09-27 DIAGNOSIS — Y9372 Activity, wrestling: Secondary | ICD-10-CM | POA: Insufficient documentation

## 2015-09-27 DIAGNOSIS — Y999 Unspecified external cause status: Secondary | ICD-10-CM | POA: Insufficient documentation

## 2015-09-27 DIAGNOSIS — F17210 Nicotine dependence, cigarettes, uncomplicated: Secondary | ICD-10-CM | POA: Insufficient documentation

## 2015-09-27 MED ORDER — HYDROCODONE-ACETAMINOPHEN 5-325 MG PO TABS
2.0000 | ORAL_TABLET | Freq: Once | ORAL | Status: AC
  Administered 2015-09-27: 2 via ORAL
  Filled 2015-09-27: qty 2

## 2015-09-27 MED ORDER — HYDROCODONE-ACETAMINOPHEN 5-325 MG PO TABS: 1.0000 | ORAL_TABLET | Freq: Four times a day (QID) | ORAL | 0 refills | Status: DC | PRN

## 2015-09-27 NOTE — ED Provider Notes (Signed)
[facility]-EMERGENCY DEPT Provider Note   CSN: [id number] Arrival date & time: 09/27/15  2020   By signing my name below, I, Clovis Pu, attest that this documentation has been prepared under the direction and in the presence of  Roxy Horseman, PA-C. Electronically Signed: Clovis Pu, ED Scribe. 09/27/15. 10:37 PM.   History   Chief Complaint Chief Complaint  Patient presents with  . Wrist Injury    The history is provided by the patient. No language interpreter was used.   HPI Comments:  Joseph Ellis is a 55 y.o. male who presents to the Emergency Department complaining of acute onset, moderate left wrist pain s/p an incident which occurred in the PM today. Pt states he was wrestling with his brother. No alleviating factors noted. Pt denies any other complaints at this time.   Past Medical History:  Diagnosis Date  . Arthritis    HANDS & FEET  . Bronchitis   . COPD (chronic obstructive pulmonary disease) (HCC)   . Pneumonia    HX OF PNA  . Shortness of breath     Patient Active Problem List   Diagnosis Date Noted  . COPD (chronic obstructive pulmonary disease) (HCC) 05/10/2013  . Tobacco abuse 05/10/2013    Past Surgical History:  Procedure Laterality Date  . NO PAST SURGERIES         Home Medications    Prior to Admission medications   Medication Sig Start Date End Date Taking? Authorizing Provider  albuterol (PROVENTIL HFA;VENTOLIN HFA) 108 (90 BASE) MCG/ACT inhaler Inhale 2 puffs into the lungs every 6 (six) hours as needed for wheezing or shortness of breath. 05/12/13   Coolidge Breeze, MD  tiotropium (SPIRIVA HANDIHALER) 18 MCG inhalation capsule Place 1 capsule (18 mcg total) into inhaler and inhale daily. 05/12/13   Coolidge Breeze, MD    Family History No family history on file.  Social History Social History  Substance Use Topics  . Smoking status: Current Every Day Smoker    Packs/day: 0.40    Years: 30.00    Types: Cigarettes  .  Smokeless tobacco: Never Used     Comment: 4-5 cigs/day  . Alcohol use Yes     Comment: beer     Allergies   Review of patient's allergies indicates no known allergies.   Review of Systems Review of Systems  Musculoskeletal: Positive for arthralgias.  Neurological: Negative for numbness.  All other systems reviewed and are negative.    Physical Exam Updated Vital Signs BP 128/78 (BP Location: Right Arm)   Pulse 77   Temp 98.2 F (36.8 C) (Oral)   Resp 20   SpO2 98%   Physical Exam Physical Exam  Constitutional: Pt appears well-developed and well-nourished. No distress.  HENT:  Head: Normocephalic and atraumatic.  Eyes: Conjunctivae are normal.  Neck: Normal range of motion.  Cardiovascular: Normal rate, regular rhythm and intact distal pulses.   Capillary refill < 3 sec  Pulmonary/Chest: Effort normal and breath sounds normal.  Musculoskeletal: Pt exhibits tenderness to palpation of left wrist with moderate swelling ROM: of left wrist is limited because of pain  Neurological: Pt  is alert. Coordination normal.  Sensation 5/5 Strength of left wrist is limited because of pain  Skin: Skin is warm and dry. Pt is not diaphoretic.  No tenting of the skin  Psychiatric: Pt has a normal mood and affect.  Nursing note and vitals reviewed.   ED Treatments / Results  DIAGNOSTIC STUDIES:  Oxygen Saturation is 98% on RA, normal by my interpretation.    COORDINATION OF CARE:  10:34 PM Discussed treatment plan with pt at bedside and pt agreed to plan.  Labs (all labs ordered are listed, but only abnormal results are displayed) Labs Reviewed - No data to display  EKG  EKG Interpretation None       Radiology Dg Wrist Complete Left  Result Date: 09/27/2015 CLINICAL DATA:  Wrestling injury this evening. EXAM: LEFT WRIST - COMPLETE 3+ VIEW COMPARISON:  None. FINDINGS: There is an acute intra-articular fracture of the distal radius, well aligned and nondisplaced. No  radiopaque foreign body. IMPRESSION: Well aligned intra-articular fracture of the distal radius Electronically Signed   By: Ellery Plunkaniel R Mitchell M.D.   On: 09/27/2015 21:59    Procedures Procedures (including critical care time)  Medications Ordered in ED Medications - No data to display   Initial Impression / Assessment and Plan / ED Course  I have reviewed the triage vital signs and the nursing notes.  Pertinent labs & imaging results that were available during my care of the patient were reviewed by me and considered in my medical decision making (see chart for details).  Clinical Course    Patient with wrist fracture seen on plain films as above.  Will place in sugar tong splint and recommend hand follow-up.    SPLINT APPLICATION Date/Time: 12:55 AM Authorized by: Roxy HorsemanBROWNING, Devaughn Savant Consent: Verbal consent obtained. Risks and benefits: risks, benefits and alternatives were discussed Consent given by: patient Splint applied by: orthopedic technician Location details: left wrist Splint type: sugar tong Supplies used: fiberglass and ace Post-procedure: The splinted body part was neurovascularly unchanged following the procedure. Patient tolerance: Patient tolerated the procedure well with no immediate complications.     Final Clinical Impressions(s) / ED Diagnoses   Final diagnoses:  Wrist fracture, left, closed, initial encounter    New Prescriptions Discharge Medication List as of 09/27/2015 11:38 PM    START taking these medications   Details  HYDROcodone-acetaminophen (NORCO/VICODIN) 5-325 MG tablet Take 1-2 tablets by mouth every 6 (six) hours as needed., Starting Thu 09/27/2015, Print       I personally performed the services described in this documentation, which was scribed in my presence. The recorded information has been reviewed and is accurate.      Roxy Horsemanobert Regine Christian, PA-C 09/28/15 [id number]    Zadie Rhineonald Wickline, MD 09/28/15 [phone]

## 2015-09-27 NOTE — Progress Notes (Signed)
Orthopedic Tech Progress Note Patient Details:  Joseph Ellis [birth year] [id number]  Ortho Devices Type of Ortho Device: Ace wrap, Arm sling, Sugartong splint Ortho Device/Splint Location: LUE Ortho Device/Splint Interventions: Ordered, Application   Jennye MoccasinHughes, Phylliss Strege Craig 09/27/2015, 10:46 PM

## 2015-09-27 NOTE — ED Triage Notes (Signed)
Pt. injured his left wrist while wrestling with his brother this evening , presents with left wrist pain and swelling .

## 2018-03-25 ENCOUNTER — Encounter (HOSPITAL_COMMUNITY): Payer: Self-pay | Admitting: Emergency Medicine

## 2018-03-25 ENCOUNTER — Emergency Department (HOSPITAL_COMMUNITY): Payer: Self-pay

## 2018-03-25 ENCOUNTER — Inpatient Hospital Stay (HOSPITAL_COMMUNITY)
Admission: EM | Admit: 2018-03-25 | Discharge: 2018-03-28 | DRG: 193 | Disposition: A | Discharge status: Home / Self Care | Payer: Self-pay | Attending: Internal Medicine | Admitting: Internal Medicine

## 2018-03-25 DIAGNOSIS — Z8701 Personal history of pneumonia (recurrent): Secondary | ICD-10-CM

## 2018-03-25 DIAGNOSIS — J101 Influenza due to other identified influenza virus with other respiratory manifestations: Principal | ICD-10-CM | POA: Diagnosis present

## 2018-03-25 DIAGNOSIS — E871 Hypo-osmolality and hyponatremia: Secondary | ICD-10-CM | POA: Diagnosis present

## 2018-03-25 DIAGNOSIS — M19041 Primary osteoarthritis, right hand: Secondary | ICD-10-CM | POA: Diagnosis present

## 2018-03-25 DIAGNOSIS — R739 Hyperglycemia, unspecified: Secondary | ICD-10-CM | POA: Diagnosis not present

## 2018-03-25 DIAGNOSIS — J439 Emphysema, unspecified: Secondary | ICD-10-CM | POA: Diagnosis present

## 2018-03-25 DIAGNOSIS — R7303 Prediabetes: Secondary | ICD-10-CM

## 2018-03-25 DIAGNOSIS — D649 Anemia, unspecified: Secondary | ICD-10-CM | POA: Diagnosis present

## 2018-03-25 DIAGNOSIS — J209 Acute bronchitis, unspecified: Secondary | ICD-10-CM | POA: Diagnosis present

## 2018-03-25 DIAGNOSIS — J9601 Acute respiratory failure with hypoxia: Secondary | ICD-10-CM | POA: Diagnosis present

## 2018-03-25 DIAGNOSIS — R0902 Hypoxemia: Secondary | ICD-10-CM

## 2018-03-25 DIAGNOSIS — M19042 Primary osteoarthritis, left hand: Secondary | ICD-10-CM | POA: Diagnosis present

## 2018-03-25 DIAGNOSIS — F17210 Nicotine dependence, cigarettes, uncomplicated: Secondary | ICD-10-CM | POA: Diagnosis present

## 2018-03-25 DIAGNOSIS — R0602 Shortness of breath: Secondary | ICD-10-CM

## 2018-03-25 HISTORY — DX: Influenza due to unidentified influenza virus with other respiratory manifestations: J11.1

## 2018-03-25 LAB — COMPREHENSIVE METABOLIC PANEL
ALBUMIN: 3.5 g/dL (ref 3.5–5.0)
ALT: 14 U/L (ref 0–44)
AST: 22 U/L (ref 15–41)
Alkaline Phosphatase: 47 U/L (ref 38–126)
Anion gap: 9 (ref 5–15)
BUN: 13 mg/dL (ref 6–20)
CHLORIDE: 100 mmol/L (ref 98–111)
CO2: 21 mmol/L — AB (ref 22–32)
Calcium: 8.2 mg/dL — ABNORMAL LOW (ref 8.9–10.3)
Creatinine, Ser: 0.83 mg/dL (ref 0.61–1.24)
GFR calc Af Amer: >60 mL/min (ref >60)
GFR calc non Af Amer: >60 mL/min (ref >60)
GLUCOSE: 102 mg/dL — AB (ref 70–99)
POTASSIUM: 4.1 mmol/L (ref 3.5–5.1)
SODIUM: 130 mmol/L — AB (ref 135–145)
Total Bilirubin: 0.8 mg/dL (ref 0.3–1.2)
Total Protein: 6.9 g/dL (ref 6.5–8.1)

## 2018-03-25 LAB — PROTIME-INR
INR: 1 (ref 0.8–1.2)
Prothrombin Time: 13.2 seconds (ref 11.4–15.2)

## 2018-03-25 LAB — POCT I-STAT EG7
Acid-base deficit: 1 mmol/L (ref 0.0–2.0)
Bicarbonate: 24.4 mmol/L (ref 20.0–28.0)
CALCIUM ION: 1.13 mmol/L — AB (ref 1.15–1.40)
HCT: 39 % (ref 39.0–52.0)
Hemoglobin: 13.3 g/dL (ref 13.0–17.0)
O2 Saturation: 56 %
PCO2 VEN: 42.7 mmHg — AB (ref 44.0–60.0)
PH VEN: 7.365 (ref 7.250–7.430)
PO2 VEN: 30 mmHg — AB (ref 32.0–45.0)
Potassium: 4 mmol/L (ref 3.5–5.1)
Sodium: 132 mmol/L — ABNORMAL LOW (ref 135–145)
TCO2: 26 mmol/L (ref 22–32)

## 2018-03-25 LAB — CBC WITH DIFFERENTIAL/PLATELET
ABS IMMATURE GRANULOCYTES: 0.06 10*3/uL (ref 0.00–0.07)
Basophils Absolute: 0 10*3/uL (ref 0.0–0.1)
Basophils Relative: 0 %
Eosinophils Absolute: 0 10*3/uL (ref 0.0–0.5)
Eosinophils Relative: 0 %
HEMATOCRIT: 42.2 % (ref 39.0–52.0)
HEMOGLOBIN: 13.7 g/dL (ref 13.0–17.0)
IMMATURE GRANULOCYTES: 0 %
LYMPHS ABS: 0.7 10*3/uL (ref 0.7–4.0)
LYMPHS PCT: 5 %
MCH: 28.4 pg (ref 26.0–34.0)
MCHC: 32.5 g/dL (ref 30.0–36.0)
MCV: 87.6 fL (ref 80.0–100.0)
MONOS PCT: 7 %
Monocytes Absolute: 1.1 10*3/uL — ABNORMAL HIGH (ref 0.1–1.0)
NEUTROS ABS: 12.8 10*3/uL — AB (ref 1.7–7.7)
NEUTROS PCT: 88 %
Platelets: 219 10*3/uL (ref 150–400)
RBC: 4.82 MIL/uL (ref 4.22–5.81)
RDW: 13.6 % (ref 11.5–15.5)
WBC: 14.7 10*3/uL — ABNORMAL HIGH (ref 4.0–10.5)
nRBC: 0 % (ref 0.0–0.2)

## 2018-03-25 LAB — INFLUENZA PANEL BY PCR (TYPE A & B)
INFLAPCR: NEGATIVE
INFLBPCR: POSITIVE — AB

## 2018-03-25 LAB — LACTIC ACID, PLASMA: Lactic Acid, Venous: 1 mmol/L (ref 0.5–1.9)

## 2018-03-25 MED ORDER — LACTATED RINGERS IV BOLUS (SEPSIS)
1000.0000 mL | Freq: Once | INTRAVENOUS | Status: AC
  Administered 2018-03-25: 1000 mL via INTRAVENOUS

## 2018-03-25 MED ORDER — ACETAMINOPHEN 500 MG PO TABS
1000.0000 mg | ORAL_TABLET | Freq: Once | ORAL | Status: AC
  Administered 2018-03-25: 1000 mg via ORAL
  Filled 2018-03-25: qty 2

## 2018-03-25 MED ORDER — SODIUM CHLORIDE 0.9 % IV SOLN
500.0000 mg | Freq: Once | INTRAVENOUS | Status: AC
  Administered 2018-03-25: 500 mg via INTRAVENOUS
  Filled 2018-03-25: qty 500

## 2018-03-25 MED ORDER — OSELTAMIVIR PHOSPHATE 75 MG PO CAPS
75.0000 mg | ORAL_CAPSULE | Freq: Once | ORAL | Status: AC
  Administered 2018-03-26: 75 mg via ORAL
  Filled 2018-03-25: qty 1

## 2018-03-25 MED ORDER — SODIUM CHLORIDE 0.9 % IV SOLN
1.0000 g | Freq: Once | INTRAVENOUS | Status: AC
  Administered 2018-03-25: 1 g via INTRAVENOUS
  Filled 2018-03-25: qty 10

## 2018-03-25 MED ORDER — METRONIDAZOLE IN NACL 5-0.79 MG/ML-% IV SOLN
500.0000 mg | Freq: Once | INTRAVENOUS | Status: AC
  Administered 2018-03-25: 500 mg via INTRAVENOUS
  Filled 2018-03-25: qty 100

## 2018-03-25 NOTE — ED Triage Notes (Addendum)
  Patient BIB EMS for flu like symptoms and abdominal pain that started about 3 days ago.  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  Patient has had abdominal pain, diarrhea and flu like symptoms for 3 days.  Patient states he took ibuprofen 200mg  at 1400 this afternoon.  Temp 102.8 on arrival.  94% on 4 L [place].  Patient A&O x4.  Patient did not receive flu vaccine this year

## 2018-03-25 NOTE — ED Provider Notes (Signed)
[facility] EMERGENCY DEPARTMENT Provider Note   CSN: [id number] Arrival date & time: 03/25/18  2128    History   Chief Complaint Chief Complaint  Patient presents with  . URI  . Abdominal Pain  . Fever    HPI Joseph Ellis is a 58 y.o. male with extensive past smoking history who presents the emergency department complaining of fevers, dyspnea, cough, nausea, and diarrhea for the past 2 days.  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  Patient denies any recent sick contacts or travel out of the state.  He states his dyspnea has progressively worsened over the course of the last 2 days.  He states his been treating himself with 200 mg ibuprofen intermittently.      Illness  Severity:  Severe Onset quality:  Gradual Duration:  3 days Timing:  Constant Progression:  Worsening Chronicity:  New Associated symptoms: cough, fever and shortness of breath   Associated symptoms: no abdominal pain, no chest pain, no ear pain, no rash, no sore throat and no vomiting     Past Medical History:  Diagnosis Date  . Arthritis    HANDS & FEET  . Bronchitis   . COPD (chronic obstructive pulmonary disease) (HCC)   . Pneumonia    HX OF PNA  . Shortness of breath     Patient Active Problem List   Diagnosis Date Noted  . Acute respiratory failure with hypoxia (HCC) 03/26/2018  . COPD (chronic obstructive pulmonary disease) (HCC) 05/10/2013  . Tobacco abuse 05/10/2013    Past Surgical History:  Procedure Laterality Date  . NO PAST SURGERIES          Home Medications    Prior to Admission medications   Medication Sig Start Date End Date Taking? Authorizing Provider  albuterol (PROVENTIL HFA;VENTOLIN HFA) 108 (90 BASE) MCG/ACT inhaler Inhale 2 puffs into the lungs every 6 (six) hours as needed for wheezing or shortness of breath. 05/12/13   Coolidge Breeze, MD   HYDROcodone-acetaminophen (NORCO/VICODIN) 5-325 MG tablet Take 1-2 tablets by mouth every 6 (six) hours as needed. 09/27/15   Roxy Horseman, PA-C  tiotropium (SPIRIVA HANDIHALER) 18 MCG inhalation capsule Place 1 capsule (18 mcg total) into inhaler and inhale daily. 05/12/13   Coolidge Breeze, MD    Family History History reviewed. No pertinent family history.  Social History Social History   Tobacco Use  . Smoking status: Current Every Day Smoker    Packs/day: 0.40    Years: 30.00    Pack years: 12.00    Types: Cigarettes  . Smokeless tobacco: Never Used  . Tobacco comment: 4-5 cigs/day  Substance Use Topics  . Alcohol use: Yes    Comment: beer  . Drug use: Yes    Types: Marijuana    Comment: last time 3 weeks ago     Allergies   Patient has no known allergies.   Review of Systems Review of Systems  Constitutional: Positive for fever. Negative for chills.  HENT: Negative for ear pain and sore throat.   Eyes: Negative for pain and visual disturbance.  Respiratory: Positive for cough and shortness of breath.   Cardiovascular: Negative for chest pain and palpitations.  Gastrointestinal: Negative for abdominal pain and vomiting.  Genitourinary: Negative for dysuria and hematuria.  Musculoskeletal: Negative for arthralgias and back pain.  Skin: Negative for color change and rash.  Neurological: Negative for seizures and  syncope.  All other systems reviewed and are negative.    Physical Exam Updated Vital Signs BP (!) 122/51   Pulse 71   Temp 100 F (37.8 C) (Oral)   Resp (!) 26   Ht 5\' 9"  (1.753 m)   Wt 61.2 kg   SpO2 96%   BMI 19.94 kg/m   Physical Exam Vitals signs and nursing note reviewed.  Constitutional:      Appearance: He is well-developed.  HENT:     Head: Normocephalic and atraumatic.  Eyes:     Conjunctiva/sclera: Conjunctivae normal.  Neck:     Musculoskeletal: Neck supple.  Cardiovascular:     Rate and Rhythm: Normal rate and  regular rhythm.     Heart sounds: No murmur.  Pulmonary:     Comments: Moderate respiratory distress.  Tachypneic.  Retracting.  No inspiratory or expiratory wheezing appreciated. Abdominal:     General: There is no distension.     Palpations: Abdomen is soft.     Tenderness: There is no abdominal tenderness. There is no guarding or rebound.  Skin:    General: Skin is warm and dry.  Neurological:     Mental Status: He is alert.      ED Treatments / Results  Labs (all labs ordered are listed, but only abnormal results are displayed) Labs Reviewed  COMPREHENSIVE METABOLIC PANEL - Abnormal; Notable for the following components:      Result Value   Sodium 130 (*)    CO2 21 (*)    Glucose, Bld 102 (*)    Calcium 8.2 (*)    All other components within normal limits  CBC WITH DIFFERENTIAL/PLATELET - Abnormal; Notable for the following components:   WBC 14.7 (*)    Neutro Abs 12.8 (*)    Monocytes Absolute 1.1 (*)    All other components within normal limits  INFLUENZA PANEL BY PCR (TYPE A & B) - Abnormal; Notable for the following components:   Influenza B By PCR POSITIVE (*)    All other components within normal limits  POCT I-STAT EG7 - Abnormal; Notable for the following components:   pCO2, Ven 42.7 (*)    pO2, Ven 30.0 (*)    Sodium 132 (*)    Calcium, Ion 1.13 (*)    All other components within normal limits  CULTURE, BLOOD (ROUTINE X 2)  CULTURE, BLOOD (ROUTINE X 2)  LACTIC ACID, PLASMA  PROTIME-INR  LACTIC ACID, PLASMA  URINALYSIS, ROUTINE W REFLEX MICROSCOPIC  I-STAT VENOUS BLOOD GAS, ED    EKG EKG Interpretation  Date/Time:  Thursday March 25 2018 22:21:11 EDT Ventricular Rate:  103 PR Interval:    QRS Duration: 86 QT Interval:  303 QTC Calculation: 397 R Axis:   66 Text Interpretation:  Sinus tachycardia Ventricular premature complex Aberrant conduction of SV complex(es) Biatrial enlargement Anterior infarct, old Borderline T abnormalities, inferior  leads Abnormal ekg Confirmed by Gerhard Munch [phone]) on 03/25/2018 11:18:51 PM   Radiology Dg Chest 2 View  Result Date: 03/25/2018 CLINICAL DATA:  Productive cough for 3 days. Suspected sepsis. Heaviness in the chest. Head and abdominal pain with fever. Labored breathing. Current smoker. EXAM: CHEST - 2 VIEW COMPARISON:  05/10/2013 FINDINGS: Pulmonary hyperinflation likely indicating emphysema. Peribronchial thickening may indicate chronic bronchitis. Normal heart size and pulmonary vascularity. No focal airspace disease or consolidation in the lungs. No blunting of costophrenic angles. No pneumothorax. Mediastinal contours appear intact. Degenerative changes in the spine. IMPRESSION: Emphysematous and chronic bronchitic changes  in the lungs. No evidence of active pulmonary disease. Electronically Signed   By: Burman Nieves M.D.   On: 03/25/2018 22:27    Procedures Procedures (including critical care time)  Medications Ordered in ED Medications  metroNIDAZOLE (FLAGYL) IVPB 500 mg (500 mg Intravenous New Bag/Given 03/25/18 2335)  oseltamivir (TAMIFLU) capsule 75 mg (has no administration in time range)  lactated ringers bolus 1,000 mL (0 mLs Intravenous Stopped 03/25/18 2256)    And  lactated ringers bolus 1,000 mL (0 mLs Intravenous Stopped 03/26/18 0015)  acetaminophen (TYLENOL) tablet 1,000 mg (1,000 mg Oral Given 03/25/18 2223)  cefTRIAXone (ROCEPHIN) 1 g in sodium chloride 0.9 % 100 mL IVPB (0 g Intravenous Stopped 03/25/18 2317)  azithromycin (ZITHROMAX) 500 mg in sodium chloride 0.9 % 250 mL IVPB (0 mg Intravenous Stopped 03/26/18 0014)     Initial Impression / Assessment and Plan / ED Course  I have reviewed the triage vital signs and the nursing notes.  Pertinent labs & imaging results that were available during my care of the patient were reviewed by me and considered in my medical decision making (see chart for details).       Patient is a 58 year old male who presents to  the emergency department complaining of cough, fevers, dyspnea, diarrhea for the past 3 days.  On initial evaluation the patient he was borderline hypotensive, febrile, tachycardic, tachypneic, and satting in the mid 80s on room air.  Physical exam as detailed above for middle-aged male in moderate respiratory distress.  Patient is tachypneic and using accessory muscles.  No wheezing, rhonchi, crackles appreciated on auscultation of the lungs.  Abdomen is benign.  Equal pulses in all 4 extremities.  Patient immediately placed on 4 L nasal cannula and code sepsis initiated. Patient was given 30 cc/kg IV fluid resuscitation.  Labs were obtained including blood cultures and patient was started on broad-spectrum antibiotics consisting of Rocephin, Flagyl, and azithromycin.  Lab work Designer, multimedia for leukocytosis.  Metabolic panel with mild hyponatremia of 130.  No other significant metabolic or electrolyte derangements present.  Chest x-ray consistent with chronic emphysema however no focal consolidations that would suggest pneumonia.  Influenza swab positive for flu B.  Lactic acid within normal limits.  Lungs largely clear to auscultation. No wheezing present. Do not feel bronchodilators would be of benefit at this time.   Following positive influenza test, Tamiflu was initiated.  Patient's tachypneic, work of breathing, and blood pressure improved following IV fluid resuscitation as well as supplemental oxygenation via nasal cannula. As patient was fluid responsive, vasopressors not indicated at this time.  Case was discussed with hospitalist service who are in agreement with admission for acute hypoxic respiratory failure secondary to influenza.  Patient was in stable condition at time of admission.   Final Clinical Impressions(s) / ED Diagnoses   Final diagnoses:  Influenza B  Hypoxemia    ED Discharge Orders    None       Leonette Monarch, MD 03/26/18 1610    Gerhard Munch, MD 03/26/18  [phone]

## 2018-03-25 NOTE — ED Notes (Signed)
Pt's I Stat Venous blood gas  PO2 was 30 this was report to Dr Jeraldine Loots.

## 2018-03-26 ENCOUNTER — Other Ambulatory Visit: Payer: Self-pay

## 2018-03-26 ENCOUNTER — Encounter (HOSPITAL_COMMUNITY): Payer: Self-pay | Admitting: Internal Medicine

## 2018-03-26 DIAGNOSIS — J111 Influenza due to unidentified influenza virus with other respiratory manifestations: Secondary | ICD-10-CM

## 2018-03-26 DIAGNOSIS — J9601 Acute respiratory failure with hypoxia: Secondary | ICD-10-CM

## 2018-03-26 DIAGNOSIS — J209 Acute bronchitis, unspecified: Secondary | ICD-10-CM

## 2018-03-26 DIAGNOSIS — J101 Influenza due to other identified influenza virus with other respiratory manifestations: Principal | ICD-10-CM

## 2018-03-26 DIAGNOSIS — R0789 Other chest pain: Secondary | ICD-10-CM

## 2018-03-26 HISTORY — DX: Influenza due to unidentified influenza virus with other respiratory manifestations: J11.1

## 2018-03-26 LAB — CBC WITH DIFFERENTIAL/PLATELET
Abs Immature Granulocytes: 0.05 10*3/uL (ref 0.00–0.07)
BASOS ABS: 0 10*3/uL (ref 0.0–0.1)
Basophils Relative: 0 %
EOS PCT: 0 %
Eosinophils Absolute: 0 10*3/uL (ref 0.0–0.5)
HEMATOCRIT: 36.8 % — AB (ref 39.0–52.0)
Hemoglobin: 11.8 g/dL — ABNORMAL LOW (ref 13.0–17.0)
Immature Granulocytes: 0 %
LYMPHS ABS: 0.9 10*3/uL (ref 0.7–4.0)
Lymphocytes Relative: 7 %
MCH: 28.1 pg (ref 26.0–34.0)
MCHC: 32.1 g/dL (ref 30.0–36.0)
MCV: 87.6 fL (ref 80.0–100.0)
Monocytes Absolute: 1 10*3/uL (ref 0.1–1.0)
Monocytes Relative: 9 %
Neutro Abs: 10.2 10*3/uL — ABNORMAL HIGH (ref 1.7–7.7)
Neutrophils Relative %: 84 %
Platelets: 190 10*3/uL (ref 150–400)
RBC: 4.2 MIL/uL — ABNORMAL LOW (ref 4.22–5.81)
RDW: 13.8 % (ref 11.5–15.5)
WBC: 12.3 10*3/uL — ABNORMAL HIGH (ref 4.0–10.5)
nRBC: 0 % (ref 0.0–0.2)

## 2018-03-26 LAB — URINALYSIS, ROUTINE W REFLEX MICROSCOPIC
BILIRUBIN URINE: NEGATIVE
Glucose, UA: NEGATIVE mg/dL
KETONES UR: 20 mg/dL — AB
LEUKOCYTE UA: NEGATIVE
Nitrite: NEGATIVE
PH: 5 (ref 5.0–8.0)
PROTEIN: 30 mg/dL — AB
Specific Gravity, Urine: 1.026 (ref 1.005–1.030)

## 2018-03-26 LAB — COMPREHENSIVE METABOLIC PANEL
ALT: 13 U/L (ref 0–44)
AST: 20 U/L (ref 15–41)
Albumin: 2.8 g/dL — ABNORMAL LOW (ref 3.5–5.0)
Alkaline Phosphatase: 39 U/L (ref 38–126)
Anion gap: 11 (ref 5–15)
BUN: 10 mg/dL (ref 6–20)
CO2: 23 mmol/L (ref 22–32)
CREATININE: 0.82 mg/dL (ref 0.61–1.24)
Calcium: 8.1 mg/dL — ABNORMAL LOW (ref 8.9–10.3)
Chloride: 101 mmol/L (ref 98–111)
GFR calc non Af Amer: >60 mL/min (ref >60)
Glucose, Bld: 87 mg/dL (ref 70–99)
Potassium: 4.1 mmol/L (ref 3.5–5.1)
Sodium: 135 mmol/L (ref 135–145)
Total Bilirubin: 0.5 mg/dL (ref 0.3–1.2)
Total Protein: 6.1 g/dL — ABNORMAL LOW (ref 6.5–8.1)

## 2018-03-26 LAB — HIV ANTIBODY (ROUTINE TESTING W REFLEX): HIV Screen 4th Generation wRfx: NONREACTIVE

## 2018-03-26 LAB — TROPONIN I: Troponin I: <0.03 ng/mL (ref <0.03)

## 2018-03-26 MED ORDER — ACETAMINOPHEN 650 MG RE SUPP: 650.0000 mg | Freq: Four times a day (QID) | RECTAL | Status: DC | PRN

## 2018-03-26 MED ORDER — AZITHROMYCIN 250 MG PO TABS
500.0000 mg | ORAL_TABLET | Freq: Every day | ORAL | Status: DC
  Administered 2018-03-26 – 2018-03-28 (×3): 500 mg via ORAL
  Filled 2018-03-26 (×3): qty 2

## 2018-03-26 MED ORDER — IPRATROPIUM BROMIDE 0.02 % IN SOLN: 0.5000 mg | Freq: Four times a day (QID) | RESPIRATORY_TRACT | Status: DC

## 2018-03-26 MED ORDER — BUDESONIDE 0.25 MG/2ML IN SUSP
0.2500 mg | Freq: Two times a day (BID) | RESPIRATORY_TRACT | Status: DC
  Administered 2018-03-26 – 2018-03-28 (×5): 0.25 mg via RESPIRATORY_TRACT
  Filled 2018-03-26 (×4): qty 2

## 2018-03-26 MED ORDER — ALBUTEROL SULFATE (2.5 MG/3ML) 0.083% IN NEBU: 2.5000 mg | INHALATION_SOLUTION | RESPIRATORY_TRACT | Status: DC | PRN

## 2018-03-26 MED ORDER — ONDANSETRON HCL 4 MG/2ML IJ SOLN: 4.0000 mg | Freq: Four times a day (QID) | INTRAMUSCULAR | Status: DC | PRN

## 2018-03-26 MED ORDER — IPRATROPIUM BROMIDE 0.02 % IN SOLN
0.5000 mg | RESPIRATORY_TRACT | Status: DC
  Administered 2018-03-26: 0.5 mg via RESPIRATORY_TRACT
  Filled 2018-03-26: qty 2.5

## 2018-03-26 MED ORDER — ALBUTEROL SULFATE (2.5 MG/3ML) 0.083% IN NEBU
2.5000 mg | INHALATION_SOLUTION | RESPIRATORY_TRACT | Status: DC
  Administered 2018-03-26: 2.5 mg via RESPIRATORY_TRACT
  Filled 2018-03-26: qty 3

## 2018-03-26 MED ORDER — OSELTAMIVIR PHOSPHATE 75 MG PO CAPS
75.0000 mg | ORAL_CAPSULE | Freq: Two times a day (BID) | ORAL | Status: DC
  Administered 2018-03-26 – 2018-03-28 (×5): 75 mg via ORAL
  Filled 2018-03-26 (×5): qty 1

## 2018-03-26 MED ORDER — ENOXAPARIN SODIUM 40 MG/0.4ML ~~LOC~~ SOLN
40.0000 mg | Freq: Every day | SUBCUTANEOUS | Status: DC
  Administered 2018-03-26 – 2018-03-28 (×3): 40 mg via SUBCUTANEOUS
  Filled 2018-03-26 (×3): qty 0.4

## 2018-03-26 MED ORDER — GUAIFENESIN ER 600 MG PO TB12
1200.0000 mg | ORAL_TABLET | Freq: Two times a day (BID) | ORAL | Status: DC
  Administered 2018-03-26 – 2018-03-28 (×5): 1200 mg via ORAL
  Filled 2018-03-26 (×6): qty 2

## 2018-03-26 MED ORDER — ALBUTEROL SULFATE (2.5 MG/3ML) 0.083% IN NEBU: 2.5000 mg | INHALATION_SOLUTION | Freq: Four times a day (QID) | RESPIRATORY_TRACT | Status: DC

## 2018-03-26 MED ORDER — ACETAMINOPHEN 325 MG PO TABS
650.0000 mg | ORAL_TABLET | Freq: Four times a day (QID) | ORAL | Status: DC | PRN
  Administered 2018-03-26: 650 mg via ORAL
  Filled 2018-03-26: qty 2

## 2018-03-26 MED ORDER — IPRATROPIUM-ALBUTEROL 0.5-2.5 (3) MG/3ML IN SOLN
3.0000 mL | Freq: Four times a day (QID) | RESPIRATORY_TRACT | Status: DC
  Administered 2018-03-26 – 2018-03-27 (×5): 3 mL via RESPIRATORY_TRACT
  Filled 2018-03-26 (×5): qty 3

## 2018-03-26 MED ORDER — METHYLPREDNISOLONE SODIUM SUCC 40 MG IJ SOLR
40.0000 mg | Freq: Two times a day (BID) | INTRAMUSCULAR | Status: DC
  Administered 2018-03-26 – 2018-03-28 (×5): 40 mg via INTRAVENOUS
  Filled 2018-03-26 (×5): qty 1

## 2018-03-26 MED ORDER — ONDANSETRON HCL 4 MG PO TABS: 4.0000 mg | ORAL_TABLET | Freq: Four times a day (QID) | ORAL | Status: DC | PRN

## 2018-03-26 NOTE — H&P (Signed)
History and Physical    Joseph Ellis BJY:[id number] DOB: 03-Sep-1960 DOA: 03/25/2018  PCP: System, Provider Not In  Patient coming from: Home.  Chief Complaint: Shortness of breath.  HPI: Joseph Ellis is a 58 y.o. male with history of tobacco abuse and possible COPD who has not been taking any medications regularly presents to the ER because of shortness of breath which has been ongoing for last 2 days.  Patient states his shortness of breath started after he went to clear something in the basement about 2 days ago.  Has been having wheezing productive cough subjective feeling of fever and chills.  Denies any recent travel or sick contacts.  Has been having some chest tightness.  Denies any nausea vomiting or diarrhea.  Has been having some headache.  Mostly in the frontal area.  Denies any focal deficits of blurred vision.  ED Course: In the ER patient was hypoxic chest x-ray was showing bronchitis changes.  Was febrile with influenza B+.  Patient was placed on empiric antibiotics initially and Tamiflu started later after influenza came positive.  Patient was placed on nebulizer steroids for bronchitis and wheezing.  Patient's headache resolved after given Tylenol.  EKG shows sinus tachycardia.  Patient admitted with acute respiratory failure with hypoxia secondary to bronchitis and influenza.  Review of Systems: As per HPI, rest all negative.   Past Medical History:  Diagnosis Date  . Arthritis    HANDS & FEET  . Bronchitis   . COPD (chronic obstructive pulmonary disease) (HCC)   . Pneumonia    HX OF PNA  . Shortness of breath     Past Surgical History:  Procedure Laterality Date  . NO PAST SURGERIES       reports that he has been smoking cigarettes. He has a 12.00 pack-year smoking history. He has never used smokeless tobacco. He reports current alcohol use. He reports current drug use. Drug: Marijuana.  No Known Allergies  Family History  Family history unknown:  Yes    Prior to Admission medications   Medication Sig Start Date End Date Taking? Authorizing Provider  albuterol (PROVENTIL HFA;VENTOLIN HFA) 108 (90 BASE) MCG/ACT inhaler Inhale 2 puffs into the lungs every 6 (six) hours as needed for wheezing or shortness of breath. 05/12/13   Coolidge Breeze, MD  HYDROcodone-acetaminophen (NORCO/VICODIN) 5-325 MG tablet Take 1-2 tablets by mouth every 6 (six) hours as needed. 09/27/15   Roxy Horseman, PA-C  tiotropium (SPIRIVA HANDIHALER) 18 MCG inhalation capsule Place 1 capsule (18 mcg total) into inhaler and inhale daily. 05/12/13   Coolidge Breeze, MD    Physical Exam: Vitals:   03/26/18 0000 03/26/18 0030 03/26/18 0058 03/26/18 0106  BP: (!) 122/51 108/66 (!) 118/105 (!) 118/105  Pulse: 71 73  74  Resp: (!) 26 (!) 32 (!) 30   Temp:   98.7 F (37.1 C) 98.7 F (37.1 C)  TempSrc:   Oral Oral  SpO2: 96% 100% 93%   Weight:   57.3 kg   Height:   5\' 9"  (1.753 m)       Constitutional: Moderately built and nourished. Vitals:   03/26/18 0000 03/26/18 0030 03/26/18 0058 03/26/18 0106  BP: (!) 122/51 108/66 (!) 118/105 (!) 118/105  Pulse: 71 73  74  Resp: (!) 26 (!) 32 (!) 30   Temp:   98.7 F (37.1 C) 98.7 F (37.1 C)  TempSrc:   Oral Oral  SpO2: 96% 100% 93%   Weight:  57.3 kg   Height:   5\' 9"  (1.753 m)    Eyes: Anicteric no pallor. ENMT: No discharge from the ears eyes nose or mouth. Neck: No mass or.  No neck rigidity. Respiratory: Bilateral expiratory wheeze and no crepitations. Cardiovascular: S1-S2 heard. Abdomen: Soft nontender bowel sounds present. Musculoskeletal: No edema.  No joint effusion. Skin: No rash. Neurologic: Alert awake oriented to time place and person.  Moves all extremities. Psychiatric: Appears normal.  Normal affect.   Labs on Admission: I have personally reviewed following labs and imaging studies  CBC: Recent Labs  Lab 03/25/18 2143 03/25/18 2235  WBC 14.7*  --   NEUTROABS 12.8*  --    HGB 13.7 13.3  HCT 42.2 39.0  MCV 87.6  --   PLT 219  --    Basic Metabolic Panel: Recent Labs  Lab 03/25/18 2143 03/25/18 2235  NA 130* 132*  K 4.1 4.0  CL 100  --   CO2 21*  --   GLUCOSE 102*  --   BUN 13  --   CREATININE 0.83  --   CALCIUM 8.2*  --    GFR: Estimated Creatinine Clearance: 77.7 mL/min (by C-G formula based on SCr of 0.83 mg/dL). Liver Function Tests: Recent Labs  Lab 03/25/18 2143  AST 22  ALT 14  ALKPHOS 47  BILITOT 0.8  PROT 6.9  ALBUMIN 3.5   No results for input(s): LIPASE, AMYLASE in the last 168 hours. No results for input(s): AMMONIA in the last 168 hours. Coagulation Profile: Recent Labs  Lab 03/25/18 2143  INR 1.0   Cardiac Enzymes: No results for input(s): CKTOTAL, CKMB, CKMBINDEX, TROPONINI in the last 168 hours. BNP (last 3 results) No results for input(s): PROBNP in the last 8760 hours. HbA1C: No results for input(s): HGBA1C in the last 72 hours. CBG: No results for input(s): GLUCAP in the last 168 hours. Lipid Profile: No results for input(s): CHOL, HDL, LDLCALC, TRIG, CHOLHDL, LDLDIRECT in the last 72 hours. Thyroid Function Tests: No results for input(s): TSH, T4TOTAL, FREET4, T3FREE, THYROIDAB in the last 72 hours. Anemia Panel: No results for input(s): VITAMINB12, FOLATE, FERRITIN, TIBC, IRON, RETICCTPCT in the last 72 hours. Urine analysis:    Component Value Date/Time   COLORURINE YELLOW 03/26/2018 0004   APPEARANCEUR CLEAR 03/26/2018 0004   LABSPEC 1.026 03/26/2018 0004   PHURINE 5.0 03/26/2018 0004   GLUCOSEU NEGATIVE 03/26/2018 0004   HGBUR SMALL (A) 03/26/2018 0004   BILIRUBINUR NEGATIVE 03/26/2018 0004   KETONESUR 20 (A) 03/26/2018 0004   PROTEINUR 30 (A) 03/26/2018 0004   NITRITE NEGATIVE 03/26/2018 0004   LEUKOCYTESUR NEGATIVE 03/26/2018 0004   Sepsis Labs: @LABRCNTIP (procalcitonin:4,lacticidven:4) )No results found for this or any previous visit (from the past 240 hour(s)).   Radiological Exams on  Admission: Dg Chest 2 View  Result Date: 03/25/2018 CLINICAL DATA:  Productive cough for 3 days. Suspected sepsis. Heaviness in the chest. Head and abdominal pain with fever. Labored breathing. Current smoker. EXAM: CHEST - 2 VIEW COMPARISON:  05/10/2013 FINDINGS: Pulmonary hyperinflation likely indicating emphysema. Peribronchial thickening may indicate chronic bronchitis. Normal heart size and pulmonary vascularity. No focal airspace disease or consolidation in the lungs. No blunting of costophrenic angles. No pneumothorax. Mediastinal contours appear intact. Degenerative changes in the spine. IMPRESSION: Emphysematous and chronic bronchitic changes in the lungs. No evidence of active pulmonary disease. Electronically Signed   By: Burman NievesWilliam  Stevens M.D.   On: 03/25/2018 22:27    EKG: Independently reviewed.  Sinus  tachycardia.  Assessment/Plan Principal Problem:   Acute respiratory failure with hypoxia (HCC) Active Problems:   Acute bronchitis   Influenza B    1. Acute respiratory failure with hypoxia secondary to COPD exacerbation with influenza.  Patient has been placed on Tamiflu.  Solu-Medrol.  Nebulizer.  Pulmicort.  Closely monitor in telemetry. 2. Chest tightness likely from wheezing.  Check cardiac markers. 3. Headache resolved with Tylenol. 4. Tobacco abuse -advised about quitting.   DVT prophylaxis: Lovenox. Code Status: Full code. Family Communication: Discussed with patient. Disposition Plan: Home. Consults called: None. Admission status: Observation.   Eduard Clos MD [facility] Pager [phone].  If 7PM-7AM, please contact night-coverage [email or web address] Password [email or web address]  03/26/2018, 3:02 AM

## 2018-03-26 NOTE — ED Notes (Signed)
  Culture sent with urine to the lab

## 2018-03-26 NOTE — Progress Notes (Signed)
Daily shift summary  Introduce self to patient after receiving report from [place]. Patient endorsed that he was feeling better than during the evening. We discussed the use of steroids, nebulizer's, tamiflu, and azithromycin as they would in conjunction help with his inflammation and shortness of breath from Influenza B. He vocalized understanding. All needs met throughout the course of the day, weaned to 3LPM [place].

## 2018-03-26 NOTE — ED Notes (Signed)
ED TO INPATIENT HANDOFF REPORT  ED Nurse Name and Phone #:  Alan Ripper  [phone]  S Name/Age/Gender Joseph Ellis 58 y.o. male Room/Bed: 023C/023C  Code Status   Code Status: Prior  Home/SNF/Other Home Patient oriented to: self, place, time and situation Is this baseline? Yes   Triage Complete: Triage complete  Chief Complaint FLU SX  Triage Note  Patient BIB EMS for flu like symptoms and abdominal pain that started about 3 days ago.  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  Patient has had abdominal pain, diarrhea and flu like symptoms for 3 days.  Patient states he took ibuprofen 200mg  at 1400 this afternoon.  Temp 102.8 on arrival.  94% on 4 L .  Patient A&O x4.  Patient did not receive flu vaccine this year   Allergies No Known Allergies  Level of Care/Admitting Diagnosis ED Disposition    ED Disposition Condition Comment   Admit  Hospital Area: [facility] [[id number]]  Level of Care: Telemetry Medical [104]  I expect the patient will be discharged within 24 hours: No (not a candidate for 5C-Observation unit)  Diagnosis: Acute respiratory failure with hypoxia [facility]) [[id number]]  Admitting Physician: Eduard Clos [phone]  Attending Physician: Eduard Clos Florian.Pax  PT Class (Do Not Modify): Observation [104]  PT Acc Code (Do Not Modify): Observation [10022]       B Medical/Surgery History Past Medical History:  Diagnosis Date  . Arthritis    HANDS & FEET  . Bronchitis   . COPD (chronic obstructive pulmonary disease) (HCC)   . Pneumonia    HX OF PNA  . Shortness of breath    Past Surgical History:  Procedure Laterality Date  . NO PAST SURGERIES       A IV Location/Drains/Wounds Patient Lines/Drains/Airways Status   Active Line/Drains/Airways    Name:   Placement date:   Placement time:   Site:   Days:   Peripheral IV 03/25/18 Right Hand   03/25/18    2133     Hand   1   Peripheral IV 03/25/18 Right Antecubital   03/25/18    2148    Antecubital   1          Intake/Output Last 24 hours No intake or output data in the 24 hours ending 03/26/18 0024  Labs/Imaging Results for orders placed or performed during the hospital encounter of 03/25/18 (from the past 48 hour(s))  Comprehensive metabolic panel     Status: Abnormal   Collection Time: 03/25/18  9:43 PM  Result Value Ref Range   Sodium 130 (L) 135 - 145 mmol/L   Potassium 4.1 3.5 - 5.1 mmol/L   Chloride 100 98 - 111 mmol/L   CO2 21 (L) 22 - 32 mmol/L   Glucose, Bld 102 (H) 70 - 99 mg/dL   BUN 13 6 - 20 mg/dL   Creatinine, Ser 0.13 0.61 - 1.24 mg/dL   Calcium 8.2 (L) 8.9 - 10.3 mg/dL   Total Protein 6.9 6.5 - 8.1 g/dL   Albumin 3.5 3.5 - 5.0 g/dL   AST 22 15 - 41 U/L   ALT 14 0 - 44 U/L   Alkaline Phosphatase 47 38 - 126 U/L   Total Bilirubin 0.8 0.3 - 1.2 mg/dL   GFR calc non Af Amer >60 >60 mL/min   GFR calc Af Amer >60 >60 mL/min   Anion  gap 9 5 - 15    Comment: Performed at [facility] Lab, [address], [place], [place] [address]  Lactic acid, plasma     Status: None   Collection Time: 03/25/18  9:43 PM  Result Value Ref Range   Lactic Acid, Venous 1.0 0.5 - 1.9 mmol/L    Comment: Performed at [facility], [address] [place], [place] [address]  CBC with Differential     Status: Abnormal   Collection Time: 03/25/18  9:43 PM  Result Value Ref Range   WBC 14.7 (H) 4.0 - 10.5 K/uL   RBC 4.82 4.22 - 5.81 MIL/uL   Hemoglobin 13.7 13.0 - 17.0 g/dL   HCT 01.7 79.3 - 90.3 %   MCV 87.6 80.0 - 100.0 fL   MCH 28.4 26.0 - 34.0 pg   MCHC 32.5 30.0 - 36.0 g/dL   RDW 00.9 23.3 - 00.7 %   Platelets 219 150 - 400 K/uL   nRBC 0.0 0.0 - 0.2 %   Neutrophils Relative % 88 %   Neutro Abs 12.8 (H) 1.7 - 7.7 K/uL   Lymphocytes Relative 5 %   Lymphs Abs 0.7 0.7 - 4.0 K/uL   Monocytes Relative 7 %   Monocytes Absolute 1.1 (H) 0.1 - 1.0 K/uL   Eosinophils Relative 0 %    Eosinophils Absolute 0.0 0.0 - 0.5 K/uL   Basophils Relative 0 %   Basophils Absolute 0.0 0.0 - 0.1 K/uL   Immature Granulocytes 0 %   Abs Immature Granulocytes 0.06 0.00 - 0.07 K/uL    Comment: Performed at [facility], [address], [place], [place] [address]  Protime-INR     Status: None   Collection Time: 03/25/18  9:43 PM  Result Value Ref Range   Prothrombin Time 13.2 11.4 - 15.2 seconds   INR 1.0 0.8 - 1.2    Comment: (NOTE) INR goal varies based on device and disease states. Performed at [facility], [address], [place], [place] [address]   POCT I-Stat EG7     Status: Abnormal   Collection Time: 03/25/18 10:35 PM  Result Value Ref Range   pH, Ven 7.365 7.250 - 7.430   pCO2, Ven 42.7 (L) 44.0 - 60.0 mmHg   pO2, Ven 30.0 (LL) 32.0 - 45.0 mmHg   Bicarbonate 24.4 20.0 - 28.0 mmol/L   TCO2 26 22 - 32 mmol/L   O2 Saturation 56.0 %   Acid-base deficit 1.0 0.0 - 2.0 mmol/L   Sodium 132 (L) 135 - 145 mmol/L   Potassium 4.0 3.5 - 5.1 mmol/L   Calcium, Ion 1.13 (L) 1.15 - 1.40 mmol/L   HCT 39.0 39.0 - 52.0 %   Hemoglobin 13.3 13.0 - 17.0 g/dL   Patient temperature HIDE    Sample type VENOUS    Comment NOTIFIED PHYSICIAN   Influenza panel by PCR (type A & B)     Status: Abnormal   Collection Time: 03/25/18 10:56 PM  Result Value Ref Range   Influenza A By PCR NEGATIVE NEGATIVE   Influenza B By PCR POSITIVE (A) NEGATIVE    Comment: (NOTE) The Xpert Xpress Flu assay is intended as an aid in the diagnosis of  influenza and should not be used as a sole basis for treatment.  This  assay is FDA approved for nasopharyngeal swab specimens only. Nasal  washings and aspirates are unacceptable for Xpert Xpress Flu testing. Performed at [facility]  Lab, [address] [place], [place] [address]    Dg Chest 2 View  Result Date: 03/25/2018 CLINICAL DATA:  Productive cough for 3 days. Suspected sepsis. Heaviness in the chest. Head and abdominal pain with fever.  Labored breathing. Current smoker. EXAM: CHEST - 2 VIEW COMPARISON:  05/10/2013 FINDINGS: Pulmonary hyperinflation likely indicating emphysema. Peribronchial thickening may indicate chronic bronchitis. Normal heart size and pulmonary vascularity. No focal airspace disease or consolidation in the lungs. No blunting of costophrenic angles. No pneumothorax. Mediastinal contours appear intact. Degenerative changes in the spine. IMPRESSION: Emphysematous and chronic bronchitic changes in the lungs. No evidence of active pulmonary disease. Electronically Signed   By: Burman NievesWilliam  Stevens M.D.   On: 03/25/2018 22:27    Pending Labs Unresulted Labs (From admission, onward)    Start     Ordered   03/25/18 2143  Lactic acid, plasma  Now then every 2 hours,   STAT     03/25/18 2142   03/25/18 2143  Culture, blood (Routine x 2)  BLOOD CULTURE X 2,   STAT     03/25/18 2142   03/25/18 2143  Urinalysis, Routine w reflex microscopic  ONCE - STAT,   STAT     03/25/18 2142          Vitals/Pain Today's Vitals   03/25/18 2330 03/25/18 2336 03/25/18 2338 03/26/18 0000  BP: (!) 89/54 (!) 91/54  (!) 122/51  Pulse: 85 74  71  Resp: (!) 27 (!) 29  (!) 26  Temp:   100 F (37.8 C)   TempSrc:   Oral   SpO2: 98% 98%  96%  Weight:      Height:      PainSc:   5      Isolation Precautions Droplet precaution  Medications Medications  metroNIDAZOLE (FLAGYL) IVPB 500 mg (500 mg Intravenous New Bag/Given 03/25/18 2335)  oseltamivir (TAMIFLU) capsule 75 mg (has no administration in time range)  lactated ringers bolus 1,000 mL (0 mLs Intravenous Stopped 03/25/18 2256)    And  lactated ringers bolus 1,000 mL (0 mLs Intravenous Stopped 03/26/18 0015)  acetaminophen (TYLENOL) tablet 1,000 mg (1,000 mg Oral Given 03/25/18 2223)  cefTRIAXone (ROCEPHIN) 1 g in sodium chloride 0.9 % 100 mL IVPB (0 g Intravenous Stopped 03/25/18 2317)  azithromycin (ZITHROMAX) 500 mg in sodium chloride 0.9 % 250 mL IVPB (0 mg Intravenous  Stopped 03/26/18 0014)    Mobility walks Low fall risk   Focused Assessments    R Recommendations: See Admitting Provider Note  Report given to:   Additional Notes:

## 2018-03-26 NOTE — Progress Notes (Signed)
The patient was admitted early this morning after midnight and H&P has been reviewed and I am in current agreement with assessment and plan done by Dr. Midge Minium.  Additional changes of the plan of care have been made accordingly including medication adjustments.  The patient is a 58 year old Caucasian thin male past medical history significant for tobacco abuse as well as COPD rhonchi recent pneumonia who presented to the emergency room because of worsening shortness of breath that has been going on for last few days.  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  He has been having significant wheezing and productive cough and fever and chills.  Denies any recent sick contacts or recent travel and also admitted to having some chest tightness.  Denied any other nausea or vomiting and presented to the emergency room was found to hypoxic with bronchitis changes noted on the x-ray.  He was febrile and also found to be influenza B positive.    Patient was empirically placed on antibiotics initially and Tamiflu was started but then influenza came back positive so than his broad-spectrum antibiotics were stopped and he was continued on azithromycin for COPD.  Was admitted for acute respiratory failure with hypoxia secondary to acute COPD exacerbation as well as influenza B.  Was also having some chest tightness and cardiac markers were cycled and his troponin initially was negative.  Patient's leukocytosis slightly improved and we will continue supportive care and continue to monitor his clinical response to intervention.  We will Repeat CXR in the a.m. along with blood work and continue to treat him for Influenza B and COPD exacerbation.

## 2018-03-27 ENCOUNTER — Observation Stay (HOSPITAL_COMMUNITY): Payer: Self-pay

## 2018-03-27 DIAGNOSIS — D649 Anemia, unspecified: Secondary | ICD-10-CM

## 2018-03-27 DIAGNOSIS — R739 Hyperglycemia, unspecified: Secondary | ICD-10-CM

## 2018-03-27 DIAGNOSIS — D72829 Elevated white blood cell count, unspecified: Secondary | ICD-10-CM

## 2018-03-27 LAB — MAGNESIUM: MAGNESIUM: 2 mg/dL (ref 1.7–2.4)

## 2018-03-27 LAB — COMPREHENSIVE METABOLIC PANEL
ALT: 13 U/L (ref 0–44)
ANION GAP: 7 (ref 5–15)
AST: 19 U/L (ref 15–41)
Albumin: 2.9 g/dL — ABNORMAL LOW (ref 3.5–5.0)
Alkaline Phosphatase: 37 U/L — ABNORMAL LOW (ref 38–126)
BUN: 8 mg/dL (ref 6–20)
CO2: 28 mmol/L (ref 22–32)
Calcium: 8.5 mg/dL — ABNORMAL LOW (ref 8.9–10.3)
Chloride: 100 mmol/L (ref 98–111)
Creatinine, Ser: 0.72 mg/dL (ref 0.61–1.24)
GFR calc Af Amer: >60 mL/min (ref >60)
GFR calc non Af Amer: >60 mL/min (ref >60)
Glucose, Bld: 129 mg/dL — ABNORMAL HIGH (ref 70–99)
POTASSIUM: 4.3 mmol/L (ref 3.5–5.1)
Sodium: 135 mmol/L (ref 135–145)
Total Bilirubin: 0.6 mg/dL (ref 0.3–1.2)
Total Protein: 6.2 g/dL — ABNORMAL LOW (ref 6.5–8.1)

## 2018-03-27 LAB — CBC WITH DIFFERENTIAL/PLATELET
Abs Immature Granulocytes: 0.06 10*3/uL (ref 0.00–0.07)
Basophils Absolute: 0 10*3/uL (ref 0.0–0.1)
Basophils Relative: 0 %
EOS ABS: 0 10*3/uL (ref 0.0–0.5)
Eosinophils Relative: 0 %
HCT: 38.3 % — ABNORMAL LOW (ref 39.0–52.0)
Hemoglobin: 12.3 g/dL — ABNORMAL LOW (ref 13.0–17.0)
Immature Granulocytes: 1 %
Lymphocytes Relative: 6 %
Lymphs Abs: 0.5 10*3/uL — ABNORMAL LOW (ref 0.7–4.0)
MCH: 28.4 pg (ref 26.0–34.0)
MCHC: 32.1 g/dL (ref 30.0–36.0)
MCV: 88.5 fL (ref 80.0–100.0)
Monocytes Absolute: 0.7 10*3/uL (ref 0.1–1.0)
Monocytes Relative: 8 %
Neutro Abs: 7.4 10*3/uL (ref 1.7–7.7)
Neutrophils Relative %: 85 %
PLATELETS: 191 10*3/uL (ref 150–400)
RBC: 4.33 MIL/uL (ref 4.22–5.81)
RDW: 13.7 % (ref 11.5–15.5)
WBC: 8.6 10*3/uL (ref 4.0–10.5)
nRBC: 0 % (ref 0.0–0.2)

## 2018-03-27 LAB — FOLATE: FOLATE: 12.5 ng/mL (ref >5.9)

## 2018-03-27 LAB — RETICULOCYTES
Immature Retic Fract: 6.7 % (ref 2.3–15.9)
RBC.: 4.33 MIL/uL (ref 4.22–5.81)
RETIC COUNT ABSOLUTE: 36.4 10*3/uL (ref 19.0–186.0)
Retic Ct Pct: 0.8 % (ref 0.4–3.1)

## 2018-03-27 LAB — VITAMIN B12: Vitamin B-12: 211 pg/mL (ref 180–914)

## 2018-03-27 LAB — IRON AND TIBC
Iron: 29 ug/dL — ABNORMAL LOW (ref 45–182)
Saturation Ratios: 11 % — ABNORMAL LOW (ref 17.9–39.5)
TIBC: 269 ug/dL (ref 250–450)
UIBC: 240 ug/dL

## 2018-03-27 LAB — FERRITIN: Ferritin: 247 ng/mL (ref 24–336)

## 2018-03-27 LAB — PHOSPHORUS: Phosphorus: 3.7 mg/dL (ref 2.5–4.6)

## 2018-03-27 MED ORDER — IPRATROPIUM-ALBUTEROL 0.5-2.5 (3) MG/3ML IN SOLN
3.0000 mL | Freq: Three times a day (TID) | RESPIRATORY_TRACT | Status: DC
  Administered 2018-03-27 – 2018-03-28 (×3): 3 mL via RESPIRATORY_TRACT
  Filled 2018-03-27 (×3): qty 3

## 2018-03-27 NOTE — Progress Notes (Signed)
PROGRESS NOTE    Joseph Ellis  WSF:[id number] DOB: 1960-05-03 DOA: 03/25/2018 PCP: System, Provider Not In   Brief Narrative:  The patient is a 58 year old Caucasian thin male past medical history significant for tobacco abuse as well as COPD rhonchi recent pneumonia who presented to the emergency room because of worsening shortness of breath that has been going on for last few days.  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  He has been having significant wheezing and productive cough and fever and chills.  Denies any recent sick contacts or recent travel and also admitted to having some chest tightness.  Denied any other nausea or vomiting and presented to the emergency room was found to hypoxic with bronchitis changes noted on the x-ray.  He was febrile and also found to be influenza B positive.    Patient was empirically placed on antibiotics initially and Tamiflu was started but then influenza came back positive so than his broad-spectrum antibiotics were stopped and he was continued on azithromycin for COPD. He as admitted for acute respiratory failure with hypoxia secondary to acute COPD exacerbation as well as influenza B.  Patient was also having some chest tightness and cardiac markers were cycled and his troponin initially was negative with subsequent ones being negative.   **He is improving slowly and will continue Nebs. Will attempt Ambulatory Home O2 Screen.   Assessment & Plan:   Principal Problem:   Acute respiratory failure with hypoxia (HCC) Active Problems:   Acute bronchitis   Influenza B  Acute Respiratory Railure with Hypoxia secondary to COPD Exacerbation with concomitant Influenza B infection.  -Continue supplemental oxygen via nasal cannula and wean as tolerated -Continuous pulse oximetry and maintain O2 saturations greater than 92% -C/w Symptomatic Treatment -Continue  DuoNeb 3 mils nebulized 3 times daily, albuterol 2.5 mg nebs every 2 PRN for wheezing, as well as budesonide 0.25 mg nebs twice daily -C/w guaifenesin 1200 mg p.o. twice daily, flutter valve, incentive spirometry -Continue with Systemic Steroids with IV Solu-Medrol 40 mg every 12h -Will place on Azithromycin 500 g p.o. daily for COPD -Currently getting Oseltamivir 75 mg po BID; C/w Droplet Precautions -Check Ambulatory Home O2 Screen prior to D/C -CXR this AM showed Chronic pulmonary hyperinflation. Heart size is normal. Aortic atherosclerosis as seen previously. The pulmonary vascularity is normal. No infiltrate, collapse or effusion. -Repeat CXR in AM  Chest Tightness  -Likely from wheezing. -Cycled Cardiac Markers and <0.03 x3 -Continue to Monitor   Headache -Improved and Resolved with Acetaminophen 650 mg po/RC q6hprn Mild Pain for Fever >/= 101.  Tobacco Abuse  -Smoking Cessation Counseling given  -Will offer Nicotine Replacement to the patient   Leukocytosis -Improving. WBC went from 14.7 -> 12.3 -> 8.6 -Continue Antivirals as Above -Continue to Monitor for S/Sx of Infection -Repeat CBC in AM   Hyperglycemia -In the Setting of Steroid Demargination -Blood Sugars on BMP/CMP ranging from 87-129 -Check HbA1c in AM  -Continue Monitor Blood Sugars Carefully -If blood sugars consistently elevated will place on Sensitive Novolog SSI AC  Normocytic Anemia -Patient's Hb/Hct went from 13.7/42.2 on admission and is now 12.3/38.3 -Checked Anemia Panel and showed a level of 29, U IBC of 240, TIBC of 269, saturation ratios of 11%, ferritin level 247, folate level 12.5, and vitamin B12 level 211 -Continue to Monitor for S/Sx of Bleeding -Repeat CBC in AM  Hyponatremia -Improved and went from 130 -> 135 -Continue to Monitor and Trend and Repeat CMP in AM   DVT prophylaxis: Enoxaparin 40 mg sq Daily Code Status: FULL CODE Family Communication: No family present at bedside    Disposition Plan: Anticipate D/C Home in the next 24-48 hours if improved and weaned off of O2  Consultants:   None  Procedures:  None   Antimicrobials:  Anti-infectives (From admission, onward)   Start     Dose/Rate Route Frequency Ordered Stop   03/26/18 1000  oseltamivir (TAMIFLU) capsule 75 mg     75 mg Oral 2 times daily 03/26/18 0301 03/30/18 2159   03/26/18 1000  azithromycin (ZITHROMAX) tablet 500 mg     500 mg Oral Daily 03/26/18 0815     03/25/18 2345  oseltamivir (TAMIFLU) capsule 75 mg     75 mg Oral  Once 03/25/18 2343 03/26/18 0046   03/25/18 2245  cefTRIAXone (ROCEPHIN) 1 g in sodium chloride 0.9 % 100 mL IVPB     1 g 200 mL/hr over 30 Minutes Intravenous  Once 03/25/18 2231 03/25/18 2317   03/25/18 2245  azithromycin (ZITHROMAX) 500 mg in sodium chloride 0.9 % 250 mL IVPB     500 mg 250 mL/hr over 60 Minutes Intravenous  Once 03/25/18 2231 03/26/18 0014   03/25/18 2245  metroNIDAZOLE (FLAGYL) IVPB 500 mg     500 mg 100 mL/hr over 60 Minutes Intravenous  Once 03/25/18 2231 03/26/18 0035     Subjective: Seen and examined at bedside and states he is doing a little bit better.  States he is coughing up some sputum.  No nausea or vomiting.  No chest tightness today.  No other concerns or complaints at this time.  Objective: Vitals:   03/26/18 2320 03/27/18 0121 03/27/18 0739 03/27/18 0824  BP: (!) 106/56   109/67  Pulse: (!) 54   64  Resp:      Temp: 98.2 F (36.8 C)   97.6 F (36.4 C)  TempSrc: Oral   Oral  SpO2: 99% 96% 99% 96%  Weight:      Height:        Intake/Output Summary (Last 24 hours) at 03/27/2018 1103 Last data filed at 03/27/2018 0604 Gross per 24 hour  Intake --  Output 925 ml  Net -925 ml   Filed Weights   03/25/18 2140 03/26/18 0058  Weight: 61.2 kg 57.3 kg   Examination: Physical Exam:  Constitutional: Thin Caucasian male in NAD and appears calm Eyes: Lids and conjunctivae normal, sclerae anicteric  ENMT: External Ears, Nose  appear normal. Grossly normal hearing. Neck: Appears normal, supple, no cervical masses, normal ROM, no appreciable thyromegaly; no JVD Respiratory: Diminished to auscultation bilaterally with coarse breath sounds and some wheezing; No appreciable crackles, rales, rhonchi. Normal respiratory effort and patient is not tachypenic. No accessory muscle use. Wearing Supplemental O2 via   Cardiovascular: RRR, no murmurs / rubs / gallops. S1 and S2 auscultated. No extremity edema.  Abdomen: Soft, non-tender, non-distended. No masses palpated. No appreciable hepatosplenomegaly. Bowel sounds positive x4.  GU: Deferred. Musculoskeletal: No clubbing / cyanosis of digits/nails. No joint deformity upper and lower extremities.  Skin: No rashes, lesions, ulcers on a limited skin evaluation. No induration; Warm and dry.  Neurologic: CN 2-12 grossly intact with no focal deficits.  Psychiatric: Normal judgment and insight. Alert and oriented x 3. Slightly anxious mood and appropriate affect.   Data Reviewed: I have personally reviewed following labs and  imaging studies  CBC: Recent Labs  Lab 03/25/18 2143 03/25/18 2235 03/26/18 0320 03/27/18 0315  WBC 14.7*  --  12.3* 8.6  NEUTROABS 12.8*  --  10.2* 7.4  HGB 13.7 13.3 11.8* 12.3*  HCT 42.2 39.0 36.8* 38.3*  MCV 87.6  --  87.6 88.5  PLT 219  --  190 191   Basic Metabolic Panel: Recent Labs  Lab 03/25/18 2143 03/25/18 2235 03/26/18 0320 03/27/18 0315  NA 130* 132* 135 135  K 4.1 4.0 4.1 4.3  CL 100  --  101 100  CO2 21*  --  23 28  GLUCOSE 102*  --  87 129*  BUN 13  --  10 8  CREATININE 0.83  --  0.82 0.72  CALCIUM 8.2*  --  8.1* 8.5*  MG  --   --   --  2.0  PHOS  --   --   --  3.7   GFR: Estimated Creatinine Clearance: 80.6 mL/min (by C-G formula based on SCr of 0.72 mg/dL). Liver Function Tests: Recent Labs  Lab 03/25/18 2143 03/26/18 0320 03/27/18 0315  AST ALT ALKPHOS 47 39 37*  BILITOT 0.8 0.5 0.6  PROT  6.9 6.1* 6.2*  ALBUMIN 3.5 2.8* 2.9*   No results for input(s): LIPASE, AMYLASE in the last 168 hours. No results for input(s): AMMONIA in the last 168 hours. Coagulation Profile: Recent Labs  Lab 03/25/18 2143  INR 1.0   Cardiac Enzymes: Recent Labs  Lab 03/26/18 0320 03/26/18 1026 03/26/18 1450  TROPONINI <0.03 <0.03 <0.03   BNP (last 3 results) No results for input(s): PROBNP in the last 8760 hours. HbA1C: No results for input(s): HGBA1C in the last 72 hours. CBG: No results for input(s): GLUCAP in the last 168 hours. Lipid Profile: No results for input(s): CHOL, HDL, LDLCALC, TRIG, CHOLHDL, LDLDIRECT in the last 72 hours. Thyroid Function Tests: No results for input(s): TSH, T4TOTAL, FREET4, T3FREE, THYROIDAB in the last 72 hours. Anemia Panel: Recent Labs    03/27/18 0315  VITAMINB12 211  FOLATE 12.5  FERRITIN 247  TIBC 269  IRON 29*  RETICCTPCT 0.8   Sepsis Labs: Recent Labs  Lab 03/25/18 2143  LATICACIDVEN 1.0    Recent Results (from the past 240 hour(s))  Culture, blood (Routine x 2)     Status: None (Preliminary result)   Collection Time: 03/25/18  9:50 PM  Result Value Ref Range Status   Specimen Description BLOOD LEFT ANTECUBITAL  Final   Special Requests   Final    BOTTLES DRAWN AEROBIC AND ANAEROBIC Blood Culture adequate volume   Culture   Final    NO GROWTH < 24 HOURS Performed at [facility], [address], [place], [place] [address]    Report Status PENDING  Incomplete  Culture, blood (Routine x 2)     Status: None (Preliminary result)   Collection Time: 03/25/18  9:50 PM  Result Value Ref Range Status   Specimen Description BLOOD RIGHT ANTECUBITAL  Final   Special Requests   Final    BOTTLES DRAWN AEROBIC AND ANAEROBIC Blood Culture adequate volume   Culture   Final    NO GROWTH < 24 HOURS Performed at [facility], [address], [place], [place] [address]    Report Status PENDING  Incomplete     Radiology  Studies: Dg Chest 2 View  Result Date: 03/25/2018 CLINICAL DATA:  Productive cough for  3 days. Suspected sepsis. Heaviness in the chest. Head and abdominal pain with fever. Labored breathing. Current smoker. EXAM: CHEST - 2 VIEW COMPARISON:  05/10/2013 FINDINGS: Pulmonary hyperinflation likely indicating emphysema. Peribronchial thickening may indicate chronic bronchitis. Normal heart size and pulmonary vascularity. No focal airspace disease or consolidation in the lungs. No blunting of costophrenic angles. No pneumothorax. Mediastinal contours appear intact. Degenerative changes in the spine. IMPRESSION: Emphysematous and chronic bronchitic changes in the lungs. No evidence of active pulmonary disease. Electronically Signed   By: Burman Nieves M.D.   On: 03/25/2018 22:27   Dg Chest Port 1 View  Result Date: 03/27/2018 CLINICAL DATA:  Cough, shortness of breath and left chest heaviness the last 3 days. EXAM: PORTABLE CHEST 1 VIEW COMPARISON:  03/25/2018 FINDINGS: Chronic pulmonary hyperinflation. Heart size is normal. Aortic atherosclerosis as seen previously. The pulmonary vascularity is normal. No infiltrate, collapse or effusion. IMPRESSION: Chronic pulmonary hyperinflation. No active or focal process identified. Electronically Signed   By: Paulina Fusi M.D.   On: 03/27/2018 07:40   Scheduled Meds:  azithromycin  500 mg Oral Daily   budesonide (PULMICORT) nebulizer solution  0.25 mg Nebulization BID   enoxaparin (LOVENOX) injection  40 mg Subcutaneous Daily   guaiFENesin  1,200 mg Oral BID   ipratropium-albuterol  3 mL Nebulization TID   methylPREDNISolone (SOLU-MEDROL) injection  40 mg Intravenous Q12H   oseltamivir  75 mg Oral BID   Continuous Infusions:   LOS: 0 days   Merlene Laughter, DO [facility] PAGER is on AMION  If 7PM-7AM, please contact night-coverage [email or web address] Password [facility] 03/27/2018, 11:03 AM

## 2018-03-27 NOTE — Progress Notes (Signed)
Initial Nutrition Assessment  DOCUMENTATION CODES:  Not applicable  INTERVENTION:  Ensure Enlive po BID, each supplement provides 350 kcal and 20 grams of protein  NUTRITION DIAGNOSIS:  Increased nutrient needs related to acute illness(Influenza) as evidenced by estimated nutrition requirements for this condition  GOAL:  Patient will meet greater than or equal to 90% of their needs  MONITOR:  PO intake, Supplement acceptance, Labs, I & O's  REASON FOR ASSESSMENT:  Consult Assessment of nutrition requirement/status  ASSESSMENT:  58 y/o male PMHx Tobacco abuse and possible COPD. Presents with SOB x2 days. In ED, CXR showed bronchitis and tested positive for influenza B. Admitted for management.   Despite what his appearance would suggest, pt reports good oral intake at home. He says he eats 3 times a day. In regards to his weight, he says his UBW is 130-135 lbs and he has never weighed more than 140 lbs; this is just how he has always been.   His son draws attention to himself and says this is how he is also; his son does in fact have an uncanny resemblance and body type. They both report they have always had a high metabolism and have struggled to gain weight. Their report is consistent with prior chart documentation; pt had been hospitalized I n 2015 and his weights during that admission were 123-128 lbs.   While he may be at his UBW, RD did report he would likely benefit from oral supplementation and weight gain.  At urging of son, he hesitantly agreed to try Ensure BID.   Labs: Albumin: 2.9, Glu: 129 Meds: PO abx, Methylprednisolone, tamiflu  Recent Labs  Lab 03/25/18 2143 03/25/18 2235 03/26/18 0320 03/27/18 0315  NA 130* 132* 135 135  K 4.1 4.0 4.1 4.3  CL 100  --  101 100  CO2 21*  --  23 28  BUN 13  --  10 8  CREATININE 0.83  --  0.82 0.72  CALCIUM 8.2*  --  8.1* 8.5*  MG  --   --   --  2.0  PHOS  --   --   --  3.7  GLUCOSE 102*  --  87 129*   NUTRITION -  FOCUSED PHYSICAL EXAM: Deferred at this time  Diet Order:   Diet Order            Diet regular Room service appropriate? Yes; Fluid consistency: Thin  Diet effective now             EDUCATION NEEDS:  Not appropriate for education at this time  Skin:  Skin Assessment: Reviewed RN Assessment  Last BM:  3/12  Height:  Ht Readings from Last 1 Encounters:  03/26/18 5\' 9"  (1.753 m)   Weight:  Wt Readings from Last 1 Encounters:  03/26/18 57.3 kg   Wt Readings from Last 10 Encounters:  03/26/18 57.3 kg  05/20/13 58.2 kg  05/11/13 57.1 kg   Ideal Body Weight:  72.73 kg  BMI:  Body mass index is 18.65 kg/m.  Estimated Nutritional Needs:  Kcal:  1950-2100 kcals (34-37 kcal/kg bw) Protein:  80-90g Pro (1.4-1.6g/kg bw) Fluid:  1.9-2.1 L fluid (80ml/kcal)  Christophe Louis RD, LDN, CNSC Clinical Nutrition Available Tues-Sat via Pager: [phone] 03/27/2018 4:10 PM

## 2018-03-28 ENCOUNTER — Inpatient Hospital Stay (HOSPITAL_COMMUNITY): Payer: Self-pay

## 2018-03-28 DIAGNOSIS — R7303 Prediabetes: Secondary | ICD-10-CM

## 2018-03-28 LAB — COMPREHENSIVE METABOLIC PANEL
ALT: 16 U/L (ref 0–44)
AST: 15 U/L (ref 15–41)
Albumin: 2.7 g/dL — ABNORMAL LOW (ref 3.5–5.0)
Alkaline Phosphatase: 38 U/L (ref 38–126)
Anion gap: 7 (ref 5–15)
BUN: 14 mg/dL (ref 6–20)
CO2: 26 mmol/L (ref 22–32)
Calcium: 8.1 mg/dL — ABNORMAL LOW (ref 8.9–10.3)
Chloride: 101 mmol/L (ref 98–111)
Creatinine, Ser: 0.82 mg/dL (ref 0.61–1.24)
GFR calc non Af Amer: >60 mL/min (ref >60)
Glucose, Bld: 128 mg/dL — ABNORMAL HIGH (ref 70–99)
Potassium: 4.3 mmol/L (ref 3.5–5.1)
Sodium: 134 mmol/L — ABNORMAL LOW (ref 135–145)
TOTAL PROTEIN: 5.9 g/dL — AB (ref 6.5–8.1)
Total Bilirubin: 0.6 mg/dL (ref 0.3–1.2)

## 2018-03-28 LAB — CBC WITH DIFFERENTIAL/PLATELET
Abs Immature Granulocytes: 0.04 10*3/uL (ref 0.00–0.07)
Basophils Absolute: 0 10*3/uL (ref 0.0–0.1)
Basophils Relative: 0 %
Eosinophils Absolute: 0.2 10*3/uL (ref 0.0–0.5)
Eosinophils Relative: 2 %
HEMATOCRIT: 36.6 % — AB (ref 39.0–52.0)
Hemoglobin: 12.1 g/dL — ABNORMAL LOW (ref 13.0–17.0)
Immature Granulocytes: 1 %
LYMPHS ABS: 0.8 10*3/uL (ref 0.7–4.0)
Lymphocytes Relative: 9 %
MCH: 29.2 pg (ref 26.0–34.0)
MCHC: 33.1 g/dL (ref 30.0–36.0)
MCV: 88.2 fL (ref 80.0–100.0)
Monocytes Absolute: 0.7 10*3/uL (ref 0.1–1.0)
Monocytes Relative: 8 %
Neutro Abs: 7.1 10*3/uL (ref 1.7–7.7)
Neutrophils Relative %: 80 %
Platelets: 211 10*3/uL (ref 150–400)
RBC: 4.15 MIL/uL — ABNORMAL LOW (ref 4.22–5.81)
RDW: 13.9 % (ref 11.5–15.5)
WBC: 8.8 10*3/uL (ref 4.0–10.5)
nRBC: 0 % (ref 0.0–0.2)

## 2018-03-28 LAB — HEMOGLOBIN A1C
Hgb A1c MFr Bld: 5.7 % — ABNORMAL HIGH (ref 4.8–5.6)
Mean Plasma Glucose: 116.89 mg/dL

## 2018-03-28 LAB — MAGNESIUM: Magnesium: 1.9 mg/dL (ref 1.7–2.4)

## 2018-03-28 LAB — PHOSPHORUS: Phosphorus: 3 mg/dL (ref 2.5–4.6)

## 2018-03-28 MED ORDER — AZITHROMYCIN 500 MG PO TABS: 500.0000 mg | ORAL_TABLET | Freq: Every day | ORAL | 0 refills | Status: DC

## 2018-03-28 MED ORDER — TIOTROPIUM BROMIDE MONOHYDRATE 18 MCG IN CAPS: 18.0000 ug | ORAL_CAPSULE | Freq: Every day | RESPIRATORY_TRACT | 0 refills | Status: DC

## 2018-03-28 MED ORDER — METHYLPREDNISOLONE SODIUM SUCC 40 MG IJ SOLR: 40.0000 mg | INTRAMUSCULAR | Status: DC

## 2018-03-28 MED ORDER — IPRATROPIUM-ALBUTEROL 0.5-2.5 (3) MG/3ML IN SOLN: 3.0000 mL | Freq: Two times a day (BID) | RESPIRATORY_TRACT | Status: DC

## 2018-03-28 MED ORDER — ALBUTEROL SULFATE HFA 108 (90 BASE) MCG/ACT IN AERS: 2.0000 | INHALATION_SPRAY | Freq: Four times a day (QID) | RESPIRATORY_TRACT | 0 refills | Status: DC | PRN

## 2018-03-28 MED ORDER — GUAIFENESIN ER 600 MG PO TB12: 1200.0000 mg | ORAL_TABLET | Freq: Two times a day (BID) | ORAL | 0 refills | Status: DC

## 2018-03-28 MED ORDER — ONDANSETRON HCL 4 MG PO TABS: 4.0000 mg | ORAL_TABLET | Freq: Four times a day (QID) | ORAL | 0 refills | Status: DC | PRN

## 2018-03-28 MED ORDER — OSELTAMIVIR PHOSPHATE 75 MG PO CAPS: 75.0000 mg | ORAL_CAPSULE | Freq: Two times a day (BID) | ORAL | 0 refills | Status: DC

## 2018-03-28 MED ORDER — ACETAMINOPHEN 325 MG PO TABS: 650.0000 mg | ORAL_TABLET | Freq: Four times a day (QID) | ORAL | 0 refills | Status: DC | PRN

## 2018-03-28 MED ORDER — PREDNISONE 10 MG (21) PO TBPK: ORAL_TABLET | ORAL | 0 refills | Status: DC

## 2018-03-28 MED ORDER — NICOTINE 14 MG/24HR TD PT24: 14.0000 mg | MEDICATED_PATCH | Freq: Every day | TRANSDERMAL | 0 refills | Status: DC

## 2018-03-28 MED ORDER — IPRATROPIUM-ALBUTEROL 0.5-2.5 (3) MG/3ML IN SOLN: 3.0000 mL | Freq: Four times a day (QID) | RESPIRATORY_TRACT | 0 refills | Status: DC | PRN

## 2018-03-28 MED ORDER — NICOTINE 14 MG/24HR TD PT24
14.0000 mg | MEDICATED_PATCH | Freq: Every day | TRANSDERMAL | Status: DC
  Administered 2018-03-28: 14 mg via TRANSDERMAL
  Filled 2018-03-28: qty 1

## 2018-03-28 NOTE — TOC Transition Note (Signed)
Transition of Care [facility]) - CM/SW Discharge Note   Patient Details  Name: Joseph Ellis MRN: [id number] Date of Birth: 1960/09/30  [facility] CM/SW Contact:  Lawerance Sabal, RN Phone Number: 03/28/2018, 12:12 PM   Clinical Narrative:     Patient will have nebulizer delivered to room and [facility] letter provided prior to DC. No other CM needs identified.   Final next level of care: Home/Self Care Barriers to Discharge: No Barriers Identified   Patient Goals and CMS Choice        Discharge Placement                       Discharge Plan and Services              DME Arranged: Nebulizer machine DME Agency: [employer]       Social Determinants of Health (SDOH) Interventions     Readmission Risk Interventions No flowsheet data found.

## 2018-03-28 NOTE — Discharge Summary (Signed)
Physician Discharge Summary  Joseph Ellis ZOX:[id number] DOB: 24-Sep-1960 DOA: 03/25/2018  PCP: System, Provider Not In  Admit date: 03/25/2018 Discharge date: 03/28/2018  Admitted From: Home Disposition: Home   Recommendations for Outpatient Follow-up:  1. Follow up with PCP in 1-2 weeks 2. Follow up with Pulmonary as an outpatient in 1-2 weeks 3. Will need Screening Colonoscopy as an 4. Please obtain CMP/CBC, Mag, Phos in one week 5. Please follow up on the following pending results:  Home Health: No  Equipment/Devices: DME Nebulizer    Discharge Condition: Stable  CODE STATUS: FULL CODE Diet recommendation: Heart Healthy Carb Modified Diet  Brief/Interim Summary: The patient is a 58 year old Caucasian thin male past medical history significant for tobacco abuse as well as COPD rhonchi recent pneumonia who presented to the emergency room because of worsening shortness of breath that hasbeen going on for last few days. 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 He has been having significant wheezing and productive cough and fever and chills. Denies any recent sick contacts or recent travel and also admitted to having some chest tightness. Denied any other nausea or vomiting and presented to the emergency room was found to hypoxic with bronchitis changes noted on the x-ray. He was febrile and also found to be influenza B positive.   Patient was empirically placed on antibiotics initially and Tamiflu was started but then influenza came back positive so than his broad-spectrum antibiotics were stopped and he was continued on azithromycin for COPD. He as admitted for acute respiratory failure with hypoxia secondary to acute COPD exacerbation as well as influenza B.Patient was also having some chest tightness and cardiac markers were cycled and his troponin initially was negative with  subsequent ones being negative.   **Interim History He improved significantly and was able to be weaned off of oxygen.  Home O2 screen was done he did not require oxygen.  He was deemed medically stable to be discharged will need to follow-up with PCP as well as Pulmonology outpatient setting and will need a screening colonoscopy as well.  Discharge Diagnoses:  Principal Problem:   Acute respiratory failure with hypoxia (HCC) Active Problems:   Acute bronchitis   Influenza B   Prediabetes  Acute Respiratory Railure with Hypoxia secondary to COPD Exacerbation with concomitant Influenza B infection.  -Continue supplemental oxygen via nasal cannula and wean as tolerated -Continuous pulse oximetry and maintain O2 saturations greater than 92% -C/w Symptomatic Treatment -Continue dDuoNeb 3 mils nebulized 3 times daily, Albuterol 2.5 mg nebs every 2 PRN for wheezing, as well as Budesonide 0.25 mg nebs twice daily while hospitalized; Resume Home Tiotropium and Albuterol Nebs and will place on DuoNeb and send with a Nebulizer PRN -C/w guaifenesin 1200 mg p.o. twice daily, flutter valve, incentive spirometry -Continued with Systemic Steroids with IV Solu-Medrol 40 mg and weaned to 40 mg IV q24h; Will send home on Sterapred Taper -Will place on Azithromycin 500 g p.o. daily for COPD for 5 days -Currently getting Oseltamivir 75 mg po BID for 5 days; C/w Droplet Precautions -Check Ambulatory Home O2 Screen prior to D/C -CXR this AM showed Artifact overlies the chest. Heart size is normal. Mediastinal shadows are normal. The lungs are hyperinflated. No infiltrate, collapse or effusion. -Home O2 screen done and patient did not desaturate -Repeat CXR in 3-6 weeks   Chest Tightness, improved  -Likely from wheezing. -  Cycled Cardiac Markers and <0.03 x3 -Continue to Monitor   Headache -Improved and Resolved with Acetaminophen 650 mg po/RC q6hprn Mild Pain for Fever >/= 101. -Continue to  Monitor  Tobacco Abuse -Smoking Cessation Counseling given  -Will offer Nicotine Replacement to the patient and place on 14 mg TD  Leukocytosis -Improving. WBC went from 14.7 -> 12.3 -> 8.6 -> 8.8 -Continue Antivirals as Above -Continue to Monitor for S/Sx of Infection -Repeat CBC as an outpatient   Hyperglycemia in the setting of PreDiabetes -In the Setting of Steroid Demargination and now recently diagnosed Prediabetes -Blood Sugars on BMP/CMP ranging from 87-129 -Checked HbA1c and was 5.7 -Continue Monitor Blood Sugars Carefully -If blood sugars consistently elevated will place on Sensitive Novolog SSI AC -Advised Dietary Counseling and Lifestyle Modifications   Normocytic Anemia -Patient's Hb/Hct went from 13.7/42.2 on admission and is now 12.1/36.6 -Checked Anemia Panel and showed a level of 29, U IBC of 240, TIBC of 269, saturation ratios of 11%, ferritin level 247, folate level 12.5, and vitamin B12 level 211 -Continue to Monitor for S/Sx of Bleeding -Will not Start Iron Replacement at this Time and will defer to PCP -Will need a Screening Colonoscopy as an outpatient  -Repeat CBC as an outpatient   Hyponatremia -Improved and went from 130 -> 135 and is now back down to 134 -Continue to Monitor and Trend and Repeat CMP in AM   Discharge Instructions  Discharge Instructions    Call MD for:  difficulty breathing, headache or visual disturbances   Complete by:  As directed    Call MD for:  extreme fatigue   Complete by:  As directed    Call MD for:  hives   Complete by:  As directed    Call MD for:  persistant dizziness or light-headedness   Complete by:  As directed    Call MD for:  persistant nausea and vomiting   Complete by:  As directed    Call MD for:  redness, tenderness, or signs of infection (pain, swelling, redness, odor or green/yellow discharge around incision site)   Complete by:  As directed    Call MD for:  severe uncontrolled pain   Complete  by:  As directed    Call MD for:  temperature >100.4   Complete by:  As directed    Diet - low sodium heart healthy   Complete by:  As directed    Diet Carb Modified   Complete by:  As directed    Discharge instructions   Complete by:  As directed    You were cared for by a hospitalist during your hospital stay. If you have any questions about your discharge medications or the care you received while you were in the hospital after you are discharged, you can call the unit and ask to speak with the hospitalist on call if the hospitalist that took care of you is not available. Once you are discharged, your primary care physician will handle any further medical issues. Please note that NO REFILLS for any discharge medications will be authorized once you are discharged, as it is imperative that you return to your primary care physician (or establish a relationship with a primary care physician if you do not have one) for your aftercare needs so that they can reassess your need for medications and monitor your lab values.  Follow up with PCP and Pulmonary in the outpatient. Take all medications as prescribed. If symptoms change or worsen please  return to the ED for evaluation   Increase activity slowly   Complete by:  As directed      Allergies as of 03/28/2018   No Known Allergies     Medication List    TAKE these medications   acetaminophen 325 MG tablet Commonly known as:  TYLENOL Take 2 tablets (650 mg total) by mouth every 6 (six) hours as needed for mild pain (or Fever >/= 101).   albuterol 108 (90 Base) MCG/ACT inhaler Commonly known as:  PROVENTIL HFA;VENTOLIN HFA Inhale 2 puffs into the lungs every 6 (six) hours as needed for wheezing or shortness of breath.   azithromycin 500 MG tablet Commonly known as:  ZITHROMAX Take 1 tablet (500 mg total) by mouth daily.   guaiFENesin 600 MG 12 hr tablet Commonly known as:  MUCINEX Take 2 tablets (1,200 mg total) by mouth 2 (two) times  daily.   ibuprofen 200 MG tablet Commonly known as:  ADVIL,MOTRIN Take 400 mg by mouth every 6 (six) hours as needed (arthritis pain).   ipratropium-albuterol 0.5-2.5 (3) MG/3ML Soln Commonly known as:  DUONEB Take 3 mLs by nebulization every 6 (six) hours as needed.   nicotine 14 mg/24hr patch Commonly known as:  NICODERM CQ - dosed in mg/24 hours Place 1 patch (14 mg total) onto the skin daily.   ondansetron 4 MG tablet Commonly known as:  ZOFRAN Take 1 tablet (4 mg total) by mouth every 6 (six) hours as needed for nausea.   oseltamivir 75 MG capsule Commonly known as:  TAMIFLU Take 1 capsule (75 mg total) by mouth 2 (two) times daily.   predniSONE 10 MG (21) Tbpk tablet Commonly known as:  STERAPRED UNI-PAK 21 TAB 6 tablets on day 1, 5 tablets on day 2, 4 tablets on day 3, 3 tablets on day 4, 2 tablets on day 5, 1 tablet on day 6 and then stop on day 7   tiotropium 18 MCG inhalation capsule Commonly known as:  Spiriva HandiHaler Place 1 capsule (18 mcg total) into inhaler and inhale daily.            Durable Medical Equipment  (From admission, onward)         Start     Ordered   03/28/18 1015  For home use only DME Nebulizer/meds  Once    Question:  Patient needs a nebulizer to treat with the following condition  Answer:  COPD (chronic obstructive pulmonary disease) (HCC)   03/28/18 1014         Follow-up Information    [facility] Follow up.   Why:  Please call and request post discharge from hosptial follow up appt Contact information: [address] [place] [address] [phone]         No Known Allergies  Consultations:  None  Procedures/Studies: Dg Chest 2 View  Result Date: 03/25/2018 CLINICAL DATA:  Productive cough for 3 days. Suspected sepsis. Heaviness in the chest. Head and abdominal pain with fever. Labored breathing. Current smoker. EXAM: CHEST - 2 VIEW COMPARISON:  05/10/2013  FINDINGS: Pulmonary hyperinflation likely indicating emphysema. Peribronchial thickening may indicate chronic bronchitis. Normal heart size and pulmonary vascularity. No focal airspace disease or consolidation in the lungs. No blunting of costophrenic angles. No pneumothorax. Mediastinal contours appear intact. Degenerative changes in the spine. IMPRESSION: Emphysematous and chronic bronchitic changes in the lungs. No evidence of active pulmonary disease. Electronically Signed   By: Marisa Cyphers.D.  On: 03/25/2018 22:27   Dg Chest Port 1 View  Result Date: 03/28/2018 CLINICAL DATA:  Shortness of breath.  Influenza a. EXAM: PORTABLE CHEST 1 VIEW COMPARISON:  03/27/2018 FINDINGS: Artifact overlies the chest. Heart size is normal. Mediastinal shadows are normal. The lungs are hyperinflated. No infiltrate, collapse or effusion. IMPRESSION: COPD.  No active disease. Electronically Signed   By: Paulina Fusi M.D.   On: 03/28/2018 07:49   Dg Chest Port 1 View  Result Date: 03/27/2018 CLINICAL DATA:  Cough, shortness of breath and left chest heaviness the last 3 days. EXAM: PORTABLE CHEST 1 VIEW COMPARISON:  03/25/2018 FINDINGS: Chronic pulmonary hyperinflation. Heart size is normal. Aortic atherosclerosis as seen previously. The pulmonary vascularity is normal. No infiltrate, collapse or effusion. IMPRESSION: Chronic pulmonary hyperinflation. No active or focal process identified. Electronically Signed   By: Paulina Fusi M.D.   On: 03/27/2018 07:40    Subjective: Seen and examined at bedside and was doing well.  Denies any chest pain, lightheadedness or dizziness.  No nausea or vomiting.  Was able to be weaned off of oxygen yesterday and did not desaturate on home O2 screen.  He was deemed stable to be discharged and feels better and SOB has improved.   Discharge Exam: Vitals:   03/28/18 0727 03/28/18 0838  BP: 120/63   Pulse: (!) 53 60  Resp: 14 16  Temp: (!) 97.5 F (36.4 C)   SpO2: 97% 97%    Vitals:   03/27/18 2315 03/28/18 0516 03/28/18 0727 03/28/18 0838  BP: 120/61 126/66 120/63   Pulse: 60 (!) 45 (!) 53 60  Resp: Temp: (!) 97.4 F (36.3 C) 98.3 F (36.8 C) (!) 97.5 F (36.4 C)   TempSrc: Oral Oral Oral   SpO2: 94% 98% 97% 97%  Weight:      Height:       General: Pt is alert, awake, not in acute distress Cardiovascular: RRR, S1/S2 +, no rubs, no gallops Respiratory: Diminished bilaterally, no wheezing, no rhonchi Abdominal: Soft, NT, ND, bowel sounds + Extremities: No LE edema, no cyanosis  The results of significant diagnostics from this hospitalization (including imaging, microbiology, ancillary and laboratory) are listed below for reference.    Microbiology: Recent Results (from the past 240 hour(s))  Culture, blood (Routine x 2)     Status: None (Preliminary result)   Collection Time: 03/25/18  9:50 PM  Result Value Ref Range Status   Specimen Description BLOOD LEFT ANTECUBITAL  Final   Special Requests   Final    BOTTLES DRAWN AEROBIC AND ANAEROBIC Blood Culture adequate volume   Culture   Final    NO GROWTH 2 DAYS Performed at [facility], [address] [place], [place] [address]    Report Status PENDING  Incomplete  Culture, blood (Routine x 2)     Status: None (Preliminary result)   Collection Time: 03/25/18  9:50 PM  Result Value Ref Range Status   Specimen Description BLOOD RIGHT ANTECUBITAL  Final   Special Requests   Final    BOTTLES DRAWN AEROBIC AND ANAEROBIC Blood Culture adequate volume   Culture   Final    NO GROWTH 2 DAYS Performed at [facility], [address] [place], [place] [address]    Report Status PENDING  Incomplete    Labs: BNP (last 3 results) No results for input(s): BNP in the last 8760 hours. Basic Metabolic Panel: Recent Labs  Lab 03/25/18  2143 03/25/18 2235 03/26/18 0320 03/27/18 0315 03/28/18 0322  NA 130* 132* 135 135 134*  K 4.1 4.0 4.1 4.3 4.3  CL 100  --  101 100 101   CO2 21*  --  23 28 26   GLUCOSE 102*  --  87 129* 128*  BUN 13  --  10 8 14   CREATININE 0.83  --  0.82 0.72 0.82  CALCIUM 8.2*  --  8.1* 8.5* 8.1*  MG  --   --   --  2.0 1.9  PHOS  --   --   --  3.7 3.0   Liver Function Tests: Recent Labs  Lab 03/25/18 2143 03/26/18 0320 03/27/18 0315 03/28/18 0322  AST 22 20 19 15   ALT 14 13 13 16   ALKPHOS 47 39 37* 38  BILITOT 0.8 0.5 0.6 0.6  PROT 6.9 6.1* 6.2* 5.9*  ALBUMIN 3.5 2.8* 2.9* 2.7*   No results for input(s): LIPASE, AMYLASE in the last 168 hours. No results for input(s): AMMONIA in the last 168 hours. CBC: Recent Labs  Lab 03/25/18 2143 03/25/18 2235 03/26/18 0320 03/27/18 0315 03/28/18 0322  WBC 14.7*  --  12.3* 8.6 8.8  NEUTROABS 12.8*  --  10.2* 7.4 7.1  HGB 13.7 13.3 11.8* 12.3* 12.1*  HCT 42.2 39.0 36.8* 38.3* 36.6*  MCV 87.6  --  87.6 88.5 88.2  PLT 219  --  190 191 211   Cardiac Enzymes: Recent Labs  Lab 03/26/18 0320 03/26/18 1026 03/26/18 1450  TROPONINI <0.03 <0.03 <0.03   BNP: Invalid input(s): POCBNP CBG: No results for input(s): GLUCAP in the last 168 hours. D-Dimer No results for input(s): DDIMER in the last 72 hours. Hgb A1c Recent Labs    03/28/18 0322  HGBA1C 5.7*   Lipid Profile No results for input(s): CHOL, HDL, LDLCALC, TRIG, CHOLHDL, LDLDIRECT in the last 72 hours. Thyroid function studies No results for input(s): TSH, T4TOTAL, T3FREE, THYROIDAB in the last 72 hours.  Invalid input(s): FREET3 Anemia work up Recent Labs    03/27/18 0315  VITAMINB12 211  FOLATE 12.5  FERRITIN 247  TIBC 269  IRON 29*  RETICCTPCT 0.8   Urinalysis    Component Value Date/Time   COLORURINE YELLOW 03/26/2018 0004   APPEARANCEUR CLEAR 03/26/2018 0004   LABSPEC 1.026 03/26/2018 0004   PHURINE 5.0 03/26/2018 0004   GLUCOSEU NEGATIVE 03/26/2018 0004   HGBUR SMALL (A) 03/26/2018 0004   BILIRUBINUR NEGATIVE 03/26/2018 0004   KETONESUR 20 (A) 03/26/2018 0004   PROTEINUR 30 (A) 03/26/2018  0004   NITRITE NEGATIVE 03/26/2018 0004   LEUKOCYTESUR NEGATIVE 03/26/2018 0004   Sepsis Labs Invalid input(s): PROCALCITONIN,  WBC,  LACTICIDVEN Microbiology Recent Results (from the past 240 hour(s))  Culture, blood (Routine x 2)     Status: None (Preliminary result)   Collection Time: 03/25/18  9:50 PM  Result Value Ref Range Status   Specimen Description BLOOD LEFT ANTECUBITAL  Final   Special Requests   Final    BOTTLES DRAWN AEROBIC AND ANAEROBIC Blood Culture adequate volume   Culture   Final    NO GROWTH 2 DAYS Performed at [facility], [address] [place], [place] [address]    Report Status PENDING  Incomplete  Culture, blood (Routine x 2)     Status: None (Preliminary result)   Collection Time: 03/25/18  9:50 PM  Result Value Ref Range Status   Specimen Description BLOOD RIGHT ANTECUBITAL  Final   Special Requests  Final    BOTTLES DRAWN AEROBIC AND ANAEROBIC Blood Culture adequate volume   Culture   Final    NO GROWTH 2 DAYS Performed at [facility], [address], [place], [place] [address]    Report Status PENDING  Incomplete   Time coordinating discharge: 35 minutes  SIGNED:  Merlene Laughter, DO [facility] 03/28/2018, 11:58 AM Pager is on AMION  If 7PM-7AM, please contact night-coverage [email or web address] Password [email or web address]

## 2018-03-28 NOTE — Progress Notes (Signed)
SATURATION QUALIFICATIONS: (This note is used to comply with regulatory documentation for home oxygen)  Patient Saturations on Room Air at Rest = 96%  Patient Saturations on Room Air while Ambulating = 94%  Patient Saturations on N/A Liters of oxygen while Ambulating = N/A%  Please briefly explain why patient needs home oxygen:did not c/o shortness of breath

## 2018-03-30 LAB — CULTURE, BLOOD (ROUTINE X 2)
CULTURE: NO GROWTH
Culture: NO GROWTH
Special Requests: ADEQUATE
Special Requests: ADEQUATE

## 2018-04-06 ENCOUNTER — Telehealth: Payer: Self-pay

## 2018-04-06 NOTE — Telephone Encounter (Signed)
Call received from Gae Gallop, RN CM requesting a hospital follow up appointment for the patient.  Informed her that an appointment has been scheduled for 04/09/2018 @ 0850

## 2018-04-09 ENCOUNTER — Ambulatory Visit: Payer: Self-pay | Attending: Family Medicine | Admitting: Family Medicine

## 2018-04-09 ENCOUNTER — Other Ambulatory Visit: Payer: Self-pay

## 2018-04-09 ENCOUNTER — Encounter: Payer: Self-pay | Admitting: Family Medicine

## 2018-04-09 DIAGNOSIS — D649 Anemia, unspecified: Secondary | ICD-10-CM

## 2018-04-09 DIAGNOSIS — Z09 Encounter for follow-up examination after completed treatment for conditions other than malignant neoplasm: Secondary | ICD-10-CM

## 2018-04-09 DIAGNOSIS — E440 Moderate protein-calorie malnutrition: Secondary | ICD-10-CM

## 2018-04-09 DIAGNOSIS — E871 Hypo-osmolality and hyponatremia: Secondary | ICD-10-CM

## 2018-04-09 DIAGNOSIS — J449 Chronic obstructive pulmonary disease, unspecified: Secondary | ICD-10-CM

## 2018-04-09 DIAGNOSIS — R7303 Prediabetes: Secondary | ICD-10-CM

## 2018-04-09 MED ORDER — TIOTROPIUM BROMIDE MONOHYDRATE 18 MCG IN CAPS: 18.0000 ug | ORAL_CAPSULE | Freq: Every day | RESPIRATORY_TRACT | 11 refills | Status: DC

## 2018-04-09 MED ORDER — ALBUTEROL SULFATE HFA 108 (90 BASE) MCG/ACT IN AERS: 2.0000 | INHALATION_SPRAY | Freq: Four times a day (QID) | RESPIRATORY_TRACT | 11 refills | Status: AC | PRN

## 2018-04-09 MED ORDER — IPRATROPIUM-ALBUTEROL 0.5-2.5 (3) MG/3ML IN SOLN: 3.0000 mL | Freq: Four times a day (QID) | RESPIRATORY_TRACT | 11 refills | Status: DC | PRN

## 2018-04-09 NOTE — Progress Notes (Signed)
Virtual Visit via Telephone Note  Due to current restrictions/limitations of-office visits due to the COVID-19 pandemic, this scheduled clinical appointment for hospital follow-up was converted to a telehealth visit  I connected with Shell R Norrington on 04/09/18 at  8:50 AM EDT by telephone and verified that I am speaking with the correct person using two identifiers.  COVID-19 prescreening questions were done by CMA, Harle Stanford,  who also initially contacted patient to set up telephone health encounter.   I discussed the limitations, risks, security and privacy concerns of performing an evaluation and management service by telephone and the availability of in person appointments. I also discussed with the patient that there may be a patient responsible charge related to this service. The patient expressed understanding and agreed to proceed.  Patient location: home Provider location: Office    History of Present Illness:      58 yo male with a history of COPD and tobacco dependence who is status post hospitalization from 3 01/04/2019 through 03/28/2018 secondary to acute respiratory failure with hypoxia secondary to influenza B and COPD.  Patient was able to be weaned off of oxygen prior to discharge.  Patient also had hemoglobin A1c of 5.7 diagnostic of prediabetes.       Patient reports that he is now feeling better.  Patient has some occasional mild cough which is nonproductive or productive of clear sputum which occurs when he initially awakens.  Patient states that he then takes his nebulizer treatment and coughing resolves.  Patient denies any current issues with chest tightness or wheezing. Patient feels that his breathing is much better with the use of nebulizer. Patient has some shortness of breath if he overexerts.  Patient reports that he continues to smoke 1 to 2 cigarettes/day.  He was unable to afford the nicotine patches that were prescribed status post hospitalization.  Patient  is currently uninsured and has difficulty with the cost of medications.        Patient states that he was also told during his hospitalization that he was prediabetic.  Patient denies any increased thirst or increased urinary frequency.  Patient had a mild increase in his appetite while on prednisone which was prescribed status post hospitalization but patient has now finished use of prednisone.  Patient denies any blurred vision.  Patient is not currently on any medication to help with prediabetes.       Patient was also told that he needs colonoscopy at some point as an outpatient.  Patient denies any abdominal pain, no unusual bruising or bleeding.  Patient has had no blood in the stool and no dark stools and abdominal pain that was present prior to hospitalization has resolved.    Patient ID: Joseph Ellis, male    DOB: 1960-09-23  MRN: [id number]  See above for HPI   Patient Active Problem List   Diagnosis Date Noted  . Prediabetes 03/28/2018  . Acute respiratory failure with hypoxia (HCC) 03/26/2018  . Acute bronchitis 03/26/2018  . Influenza B 03/26/2018  . COPD (chronic obstructive pulmonary disease) (HCC) 05/10/2013  . Tobacco abuse 05/10/2013     Current Outpatient Medications on File Prior to Visit  Medication Sig Dispense Refill  . acetaminophen (TYLENOL) 325 MG tablet Take 2 tablets (650 mg total) by mouth every 6 (six) hours as needed for mild pain (or Fever >/= 101). 30 tablet 0  . azithromycin (ZITHROMAX) 500 MG tablet Take 1 tablet (500 mg total) by mouth daily. 1 tablet 0  .  guaiFENesin (MUCINEX) 600 MG 12 hr tablet Take 2 tablets (1,200 mg total) by mouth 2 (two) times daily. 14 tablet 0  . ibuprofen (ADVIL,MOTRIN) 200 MG tablet Take 400 mg by mouth every 6 (six) hours as needed (arthritis pain).    . ondansetron (ZOFRAN) 4 MG tablet Take 1 tablet (4 mg total) by mouth every 6 (six) hours as needed for nausea. 20 tablet 0  . nicotine (NICODERM CQ - DOSED IN MG/24  HOURS) 14 mg/24hr patch Place 1 patch (14 mg total) onto the skin daily. (Patient not taking: Reported on 04/09/2018) 28 patch 0  . oseltamivir (TAMIFLU) 75 MG capsule Take 1 capsule (75 mg total) by mouth 2 (two) times daily. (Patient not taking: Reported on 04/09/2018) 4 capsule 0  . predniSONE (STERAPRED UNI-PAK 21 TAB) 10 MG (21) TBPK tablet 6 tablets on day 1, 5 tablets on day 2, 4 tablets on day 3, 3 tablets on day 4, 2 tablets on day 5, 1 tablet on day 6 and then stop on day 7 (Patient not taking: Reported on 04/09/2018) 21 tablet 0   No current facility-administered medications on file prior to visit.     No Known Allergies  Social History   Tobacco Use  . Smoking status: Current Every Day Smoker    Packs/day: 0.40    Years: 30.00    Pack years: 12.00    Types: Cigarettes  . Smokeless tobacco: Never Used  . Tobacco comment: 4-5 cigs/day  Substance Use Topics  . Alcohol use: Yes    Comment: beer  . Drug use: Yes    Types: Marijuana    Comment: last time 3 weeks ago  Patient reports that he is currently down to 1 or 2 cigarettes/day, no recent use of any other drugs  Family History  Problem Relation Age of Onset  . Diabetes Mother   . Hypertension Mother   . COPD Father     Past Surgical History:  Procedure Laterality Date  . NO PAST SURGERIES      ROS: Review of Systems  Review of Systems  Constitutional: Positive for malaise/fatigue. Negative for chills and fever.  HENT: Positive for congestion (improved). Negative for ear discharge, ear pain, nosebleeds and sore throat.   Eyes: Negative for blurred vision and double vision.  Respiratory: Positive for cough, sputum production (clear) and shortness of breath. Negative for wheezing.   Cardiovascular: Negative for palpitations.  Gastrointestinal: Negative for abdominal pain, constipation, diarrhea and nausea.  Genitourinary: Negative for dysuria and frequency.  Musculoskeletal: Negative for joint pain and myalgias.    Skin: Negative for itching and rash.  Neurological: Negative for dizziness and headaches.  Endo/Heme/Allergies: Positive for environmental allergies (pollen, cut grass). Negative for polydipsia. Does not bruise/bleed easily.   Physical exam: Physical exam was not performed and no vital signs obtained as this encounter was done by telephone/virtual visit  Assessment and Plan: 1. Chronic obstructive pulmonary disease, unspecified COPD type (HCC) Patient reports improvement in his COPD status post recent hospitalization for acute exacerbation of COPD secondary to influenza B infection and patient with secondary acute respiratory failure with hypoxia.  Patient does not have continued use of oxygen.  Patient will be referred to social work as he will need help with application for disability as well as with applying for financial assistance program through this clinic to help with the cost of medications and further follow-up.  Patient will also be referred to pulmonology and fortunately there is a pulmonologist in  this practice to whom patient will be referred for further evaluation and treatment.  Appointment will be made in the next 3 to 4 weeks but depending on severity of COVID-19 at the time of the appointment, this appointment may need to be rescheduled or done by telephone with additional testing at a future date.  Patient had chest x-ray done on 03/25/2018 with pulmonary hyperinflation and impression of chest x-ray was emphysematous and chronic bronchitic changes in the lungs.  Patient is being provided with refills of medications as prescribed at his hospital discharge and these prescriptions will be sent to pharmacy along with a note to the pharmacist to see if patient is eligible for patient assistance programs to help with the cost of medications or if patient is eligible to have 1 time for a refill of current medication status post hospital discharge. - Ambulatory referral to Social Work -  Ambulatory referral to Pulmonology - ipratropium-albuterol (DUONEB) 0.5-2.5 (3) MG/3ML SOLN; Take 3 mLs by nebulization every 6 (six) hours as needed.  Dispense: 360 mL; Refill: 11 - tiotropium (SPIRIVA HANDIHALER) 18 MCG inhalation capsule; Place 1 capsule (18 mcg total) into inhaler and inhale daily.  Dispense: 30 capsule; Refill: 11 - albuterol (PROVENTIL HFA;VENTOLIN HFA) 108 (90 Base) MCG/ACT inhaler; Inhale 2 puffs into the lungs every 6 (six) hours as needed for wheezing or shortness of breath.  Dispense: 2 Inhaler; Refill: 11  2. Prediabetes Discussed with the patient that his hemoglobin A1c was 5.7 during recent hospitalization which is consistent with a mild increased future risk of developing diabetes as normal hemoglobin A1c is 5.6 or less.  He does report family history of mother with diabetes but also could have elevated hemoglobin A1c if he had been using steroids in the months prior to blood work and patient had also received steroids during hospitalization prior to hemoglobin A1c being done on 03/28/2018.  Will repeat hemoglobin A1c in 3 to 4 months.  3. Protein-calorie malnutrition, moderate (HCC) Based on patient's height and weight at the time of his hospitalization, patient with BMI of 18 and patient with a total protein decreased to 6.1 and albumin decreased to 2.8 consistent with protein calorie malnutrition.  Labs on day of hospital discharge showed a total protein of 5.9 and albumin of 2.7.  Patient is encouraged to follow a healthy diet and to make sure that he is having a meal at least every 6 hours.  Patient will have CMP at upcoming lab visit in follow-up of his protein calorie malnutrition and patient also had  hyponatremia during hospitalization. - Comprehensive metabolic panel; Future  4.  Normocytic anemia Patient with normocytic anemia during hospitalization with hemoglobin of 12.1 on 03/28/2018.  Patient also had low iron at 29 and low iron saturation of 11%.  Patient  will need repeat CBC and patient will need colonoscopy per discharge summary.  Referral will be made for colonoscopy however due to COVID-19 pandemic, most procedures have been postponed. - Comprehensive metabolic panel; Future  5. Hospital discharge follow-up Patient's hospital admission and discharge summaries as well as labs, imaging and notes were reviewed and these were also discussed with the patient during today's visit.  Patient will return to clinic for labs and follow-up of his recent hospitalization and patient will be referred to see a pulmonologist.  Patient additionally will be referred to gastroenterology for colonoscopy.  Patient's medications as per hospital discharge were also reordered at today's visit.  6.  Hyponatremia Patient with mild hyponatremia during his  hospitalization and this will be rechecked with upcoming labs.  Patient denies any dizziness/loss of balance.  Follow Up Instructions:Return in about 4 weeks (around 05/07/2018) for COPD; lab in 1-2 weeks; Dr. Delford Field in 3-4 weeks. (lab order placed for CBC and CMET)    I discussed the assessment and treatment plan with the patient. The patient was provided an opportunity to ask questions and all were answered. The patient agreed with the plan and demonstrated an understanding of the instructions.   The patient was advised to call back or seek an in-person evaluation if the symptoms worsen or if the condition fails to improve as anticipated.  I provided 15 minutes of non-face-to-face time during this encounter.   Cain Saupe, MD

## 2018-04-09 NOTE — Progress Notes (Signed)
Patient was called and DO has been verified. Patient has been screened and transferred to PCP.

## 2018-04-16 ENCOUNTER — Telehealth: Payer: Self-pay | Admitting: Licensed Clinical Social Worker

## 2018-04-16 NOTE — Telephone Encounter (Signed)
Patients call returned.  Patient identified by name and date of birth. Patient had a need to figure out which medications had he had refills on.  Patient advised as to which medication had refills.  Patient acknowledged understanding of information.

## 2018-04-16 NOTE — Telephone Encounter (Signed)
Call placed to patient. LCSWA introduced self and explained role at [facility]. Pt was informed of consult from PCP to address disability and financial counseling application process.   Pt reported that he applied for Financial Assistance through the hospital when he was admitted. He has had the opportunity to follow up with the financial counselor and was informed his application is pending. No additional concerns noted.   Pt requests a call from nurse regarding his medications. States [facility] contacted brother about medication request; however, was unsure which meds needed to be filled. Please follow up

## 2018-04-22 ENCOUNTER — Other Ambulatory Visit: Payer: Self-pay

## 2018-04-22 ENCOUNTER — Ambulatory Visit: Payer: Self-pay | Attending: Family Medicine

## 2018-04-22 DIAGNOSIS — D649 Anemia, unspecified: Secondary | ICD-10-CM

## 2018-04-22 DIAGNOSIS — E871 Hypo-osmolality and hyponatremia: Secondary | ICD-10-CM

## 2018-04-22 DIAGNOSIS — E440 Moderate protein-calorie malnutrition: Secondary | ICD-10-CM

## 2018-04-23 LAB — COMPREHENSIVE METABOLIC PANEL WITH GFR
ALT: 11 IU/L (ref 0–44)
AST: 14 IU/L (ref 0–40)
Albumin/Globulin Ratio: 2.5 — ABNORMAL HIGH (ref 1.2–2.2)
Albumin: 4.2 g/dL (ref 3.8–4.9)
Alkaline Phosphatase: 51 IU/L (ref 39–117)
BUN/Creatinine Ratio: 7 — ABNORMAL LOW (ref 9–20)
BUN: 6 mg/dL (ref 6–24)
Bilirubin Total: 0.5 mg/dL (ref 0.0–1.2)
CO2: 24 mmol/L (ref 20–29)
Calcium: 8.7 mg/dL (ref 8.7–10.2)
Chloride: 101 mmol/L (ref 96–106)
Creatinine, Ser: 0.82 mg/dL (ref 0.76–1.27)
GFR calc Af Amer: 112 mL/min/1.73
GFR calc non Af Amer: 97 mL/min/1.73
Globulin, Total: 1.7 g/dL (ref 1.5–4.5)
Glucose: 119 mg/dL — ABNORMAL HIGH (ref 65–99)
Potassium: 4 mmol/L (ref 3.5–5.2)
Sodium: 140 mmol/L (ref 134–144)
Total Protein: 5.9 g/dL — ABNORMAL LOW (ref 6.0–8.5)

## 2018-04-23 LAB — CBC WITH DIFFERENTIAL/PLATELET
Basophils Absolute: 0 x10E3/uL (ref 0.0–0.2)
Basos: 1 %
EOS (ABSOLUTE): 0.2 x10E3/uL (ref 0.0–0.4)
Eos: 3 %
Hematocrit: 38.6 % (ref 37.5–51.0)
Hemoglobin: 12.6 g/dL — ABNORMAL LOW (ref 13.0–17.7)
Immature Grans (Abs): 0 x10E3/uL (ref 0.0–0.1)
Immature Granulocytes: 0 %
Lymphocytes Absolute: 1.5 x10E3/uL (ref 0.7–3.1)
Lymphs: 25 %
MCH: 29.1 pg (ref 26.6–33.0)
MCHC: 32.6 g/dL (ref 31.5–35.7)
MCV: 89 fL (ref 79–97)
Monocytes Absolute: 0.5 x10E3/uL (ref 0.1–0.9)
Monocytes: 8 %
Neutrophils Absolute: 3.8 x10E3/uL (ref 1.4–7.0)
Neutrophils: 63 %
Platelets: 283 x10E3/uL (ref 150–450)
RBC: 4.33 x10E6/uL (ref 4.14–5.80)
RDW: 13.5 % (ref 11.6–15.4)
WBC: 5.9 x10E3/uL (ref 3.4–10.8)

## 2018-04-27 ENCOUNTER — Telehealth: Payer: Self-pay | Admitting: *Deleted

## 2018-04-27 NOTE — Telephone Encounter (Signed)
NO answer and no VM option for patient.

## 2018-04-27 NOTE — Telephone Encounter (Signed)
-----   Message from Cain Saupe, MD sent at 04/23/2018  6:31 PM EDT ----- Hemoglobin of 12. 6 which is slightly improved but still consistent with anemia (normal Hgb 13-17.7). CMP shows improved nutritional status

## 2018-05-16 NOTE — Progress Notes (Deleted)
Subjective:    Patient ID: Joseph Ellis, male    DOB: [birth year], 58 y.o.   MRN: [id number] Virtual Visit via Telephone Note  I connected with Joseph Ellis on 05/17/18 at  8:30 AM EDT by telephone and verified that I am speaking with the correct person using two identifiers.   Consent:  I discussed the limitations, risks, security and privacy concerns of performing an evaluation and management service by telephone and the availability of in person appointments. I also discussed with the patient that there may be a patient responsible charge related to this service. The patient expressed understanding and agreed to proceed.  Location of patient:  Location of provider:  Persons participating in the televisit with the patient.       History of Present Illness:      Copd referral from Dr Jillyn HiddenFulp. Hx on 3/27: Chronic obstructive pulmonary disease, unspecified COPD type (HCC) Patient reports improvement in his COPD status post recent hospitalization for acute exacerbation of COPD secondary to influenza B infection and patient with secondary acute respiratory failure with hypoxia.  Patient does not have continued use of oxygen.  Patient will be referred to social work as he will need help with application for disability as well as with applying for financial assistance program through this clinic to help with the cost of medications and further follow-up.  Patient will also be referred to pulmonology and fortunately there is a pulmonologist in this practice to whom patient will be referred for further evaluation and treatment.  Appointment will be made in the next 3 to 4 weeks but depending on severity of COVID-19 at the time of the appointment, this appointment may need to be rescheduled or done by telephone with additional testing at a future date.  Patient had chest x-ray done on 03/25/2018 with pulmonary hyperinflation and impression of chest x-ray was emphysematous and chronic  bronchitic changes in the lungs.  Patient is being provided with refills of medications as prescribed at his hospital discharge and these prescriptions will be sent to pharmacy along with a note to the pharmacist to see if patient is eligible for patient assistance programs to help with the cost of medications or if patient is eligible to have 1 time for a refill of current medication status post hospital discharge.     Review of Systems   Observations/Objective:   Assessment and Plan: 1. Chronic obstructive pulmonary disease, unspecified COPD type (HCC) Patient reports improvement in his COPD status post recent hospitalization for acute exacerbation of COPD secondary to influenza B infection and patient with secondary acute respiratory failure with hypoxia.  Patient does not have continued use of oxygen.  Patient will be referred to social work as he will need help with application for disability as well as with applying for financial assistance program through this clinic to help with the cost of medications and further follow-up.  Patient will also be referred to pulmonology and fortunately there is a pulmonologist in this practice to whom patient will be referred for further evaluation and treatment.  Appointment will be made in the next 3 to 4 weeks but depending on severity of COVID-19 at the time of the appointment, this appointment may need to be rescheduled or done by telephone with additional testing at a future date.  Patient had chest x-ray done on 03/25/2018 with pulmonary hyperinflation and impression of chest x-ray was emphysematous and chronic bronchitic changes in the lungs.  Patient is being provided with  refills of medications as prescribed at his hospital discharge and these prescriptions will be sent to pharmacy along with a note to the pharmacist to see if patient is eligible for patient assistance programs to help with the cost of medications or if patient is eligible to have 1 time  for a refill of current medication status post hospital discharge. - Ambulatory referral to Social Work - Ambulatory referral to Pulmonology - ipratropium-albuterol (DUONEB) 0.5-2.5 (3) MG/3ML SOLN; Take 3 mLs by nebulization every 6 (six) hours as needed.  Dispense: 360 mL; Refill: 11 - tiotropium (SPIRIVA HANDIHALER) 18 MCG inhalation capsule; Place 1 capsule (18 mcg total) into inhaler and inhale daily.  Dispense: 30 capsule; Refill: 11 - albuterol (PROVENTIL HFA;VENTOLIN HFA) 108 (90 Base) MCG/ACT inhaler; Inhale 2 puffs into the lungs every 6 (six) hours as needed for wheezing or shortness of breath.  Dispense: 2 Inhaler; Refill: 11  Follow Up Instructions:    I discussed the assessment and treatment plan with the patient. The patient was provided an opportunity to ask questions and all were answered. The patient agreed with the plan and demonstrated an understanding of the instructions.   The patient was advised to call back or seek an in-person evaluation if the symptoms worsen or if the condition fails to improve as anticipated.  I provided *** minutes of non-face-to-face time during this encounter  including  median intraservice time , review of notes, labs, imaging, medications  and explaining diagnosis and management to the patient .    Shan Levans, MD

## 2018-05-17 ENCOUNTER — Other Ambulatory Visit: Payer: Self-pay

## 2018-05-17 ENCOUNTER — Encounter: Payer: Self-pay | Admitting: Critical Care Medicine

## 2018-05-17 ENCOUNTER — Ambulatory Visit: Payer: Self-pay | Attending: Critical Care Medicine | Admitting: Critical Care Medicine

## 2018-05-17 VITALS — BP 149/80 | HR 61 | Resp 20 | Wt 130.0 lb

## 2018-05-17 DIAGNOSIS — S39012A Strain of muscle, fascia and tendon of lower back, initial encounter: Secondary | ICD-10-CM

## 2018-05-17 DIAGNOSIS — Z1211 Encounter for screening for malignant neoplasm of colon: Secondary | ICD-10-CM

## 2018-05-17 DIAGNOSIS — Z720 Tobacco use: Secondary | ICD-10-CM

## 2018-05-17 DIAGNOSIS — J449 Chronic obstructive pulmonary disease, unspecified: Secondary | ICD-10-CM

## 2018-05-17 DIAGNOSIS — Z1159 Encounter for screening for other viral diseases: Secondary | ICD-10-CM

## 2018-05-17 MED ORDER — TIOTROPIUM BROMIDE MONOHYDRATE 18 MCG IN CAPS: 18.0000 ug | ORAL_CAPSULE | Freq: Every day | RESPIRATORY_TRACT | 11 refills | Status: DC

## 2018-05-17 MED ORDER — METHOCARBAMOL 500 MG PO TABS: 500.0000 mg | ORAL_TABLET | Freq: Three times a day (TID) | ORAL | 0 refills | Status: DC | PRN

## 2018-05-17 MED FILL — METHOCARBAMOL 500 MG TABS: 500 | 10 days supply | Qty: 30 | Fill #0

## 2018-05-17 MED FILL — SPIRIVA 18 MCG CP-HANDIHALE: 18 | 30 days supply | Qty: 30 | Fill #0

## 2018-05-17 NOTE — Assessment & Plan Note (Signed)
Ongoing tobacco use  Smoking cessation counseling was given to the patient 

## 2018-05-17 NOTE — Addendum Note (Signed)
Addended by: Shan Levans E on: 05/17/2018 11:43 AM   Modules accepted: Orders

## 2018-05-17 NOTE — Assessment & Plan Note (Signed)
Chronic obstructive lung disease with emphysematous component seen on imaging and active smoking use  The patient was counseled on smoking cessation and we will begin Spiriva 1 capsule daily 2 inhalations.  We sent the prescription to the our pharmacy and will enroll the patient in patient assistance  The patient will continue DuoNeb on an as-needed basis  No additional prednisone or antibiotics are indicated  Once the pulmonary function lab reopens we will obtain pulmonary function studies at a later date

## 2018-05-17 NOTE — Patient Instructions (Signed)
Begin Spiriva 1 capsule inhaled twice on this daily Stay on nebulizer 4 times daily as needed You may use the albuterol handheld inhaler as needed Begin Robaxin 1 tablet 3 times daily as needed for muscle spasm Follow back exercises as below  A tetanus vaccine was given Hepatitis C study was obtained and a stool card to check for blood was given to you to send back in  Return to Dr. Delford FieldWright for follow-up in 2 months   Back Exercises If you have pain in your back, do these exercises 2-3 times each day or as told by your doctor. When the pain goes away, do the exercises once each day, but repeat the steps more times for each exercise (do more repetitions). If you do not have pain in your back, do these exercises once each day or as told by your doctor. Exercises Single Knee to Chest Do these steps 3-5 times in a row for each leg: 1. Lie on your back on a firm bed or the floor with your legs stretched out. 2. Bring one knee to your chest. 3. Hold your knee to your chest by grabbing your knee or thigh. 4. Pull on your knee until you feel a gentle stretch in your lower back. 5. Keep doing the stretch for 10-30 seconds. 6. Slowly let go of your leg and straighten it. Pelvic Tilt Do these steps 5-10 times in a row: 1. Lie on your back on a firm bed or the floor with your legs stretched out. 2. Bend your knees so they point up to the ceiling. Your feet should be flat on the floor. 3. Tighten your lower belly (abdomen) muscles to press your lower back against the floor. This will make your tailbone point up to the ceiling instead of pointing down to your feet or the floor. 4. Stay in this position for 5-10 seconds while you gently tighten your muscles and breathe evenly. Cat-Cow Do these steps until your lower back bends more easily: 1. Get on your hands and knees on a firm surface. Keep your hands under your shoulders, and keep your knees under your hips. You may put padding under your knees.  2. Let your head hang down, and make your tailbone point down to the floor so your lower back is round like the back of a cat. 3. Stay in this position for 5 seconds. 4. Slowly lift your head and make your tailbone point up to the ceiling so your back hangs low (sags) like the back of a cow. 5. Stay in this position for 5 seconds.  Press-Ups Do these steps 5-10 times in a row: 1. Lie on your belly (face-down) on the floor. 2. Place your hands near your head, about shoulder-width apart. 3. While you keep your back relaxed and keep your hips on the floor, slowly straighten your arms to raise the top half of your body and lift your shoulders. Do not use your back muscles. To make yourself more comfortable, you may change where you place your hands. 4. Stay in this position for 5 seconds. 5. Slowly return to lying flat on the floor.  Bridges Do these steps 10 times in a row: 1. Lie on your back on a firm surface. 2. Bend your knees so they point up to the ceiling. Your feet should be flat on the floor. 3. Tighten your butt muscles and lift your butt off of the floor until your waist is almost as high as your knees.  If you do not feel the muscles working in your butt and the back of your thighs, slide your feet 1-2 inches farther away from your butt. 4. Stay in this position for 3-5 seconds. 5. Slowly lower your butt to the floor, and let your butt muscles relax. If this exercise is too easy, try doing it with your arms crossed over your chest. Belly Crunches Do these steps 5-10 times in a row: 1. Lie on your back on a firm bed or the floor with your legs stretched out. 2. Bend your knees so they point up to the ceiling. Your feet should be flat on the floor. 3. Cross your arms over your chest. 4. Tip your chin a little bit toward your chest but do not bend your neck. 5. Tighten your belly muscles and slowly raise your chest just enough to lift your shoulder blades a tiny bit off of the floor.  6. Slowly lower your chest and your head to the floor. Back Lifts Do these steps 5-10 times in a row: 1. Lie on your belly (face-down) with your arms at your sides, and rest your forehead on the floor. 2. Tighten the muscles in your legs and your butt. 3. Slowly lift your chest off of the floor while you keep your hips on the floor. Keep the back of your head in line with the curve in your back. Look at the floor while you do this. 4. Stay in this position for 3-5 seconds. 5. Slowly lower your chest and your face to the floor. Contact a doctor if:  Your back pain gets a lot worse when you do an exercise.  Your back pain does not lessen 2 hours after you exercise. If you have any of these problems, stop doing the exercises. Do not do them again unless your doctor says it is okay. Get help right away if:  You have sudden, very bad back pain. If this happens, stop doing the exercises. Do not do them again unless your doctor says it is okay. This information is not intended to replace advice given to you by your health care provider. Make sure you discuss any questions you have with your health care provider. Document Released: 02/01/2010 Document Revised: 09/23/2017 Document Reviewed: 02/23/2014 Elsevier Interactive Patient Education  [employer].

## 2018-05-17 NOTE — Progress Notes (Signed)
Discomfort in lower back No relief with heat compress and Tylenol

## 2018-05-17 NOTE — Assessment & Plan Note (Signed)
Current neurologic exam is unremarkable there is tenderness in the low back area para spinal in the lumbar area  We will add Robaxin 500 mg 3 times daily as needed and encouraged the patient to continue Advil or Tylenol for pain  We will also get the patient back exercises

## 2018-05-17 NOTE — Progress Notes (Signed)
Subjective:    Patient ID: Joseph Ellis, male    DOB: [birth year], 58 y.o.   MRN: [id number]  Copd referral from Dr Chapman Fitch.  58 y.o.M with Copd, prediabetes.  Adm 3/12-3/17 for Influenza B and Copd exac.  The patient states since discharge he has improved using the nebulized DuoNeb 4 times daily.  He did not receive the Spiriva inhaler as of yet.  The patient was seen in post hospital follow-up on March 27 by Dr. Chapman Fitch and was improving.  The patient's cough is less.  He has less shortness of breath.  He is still actively smoking but is reduced it to 1 pack a week instead of a pack a day.  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  His weight is up 4 pounds and he is eating better.  He denies any ongoing fever.  He is not requiring oxygen at this time.  Note there was a question of whether he was tested for COVID or not and upon chart review he was not tested for COVID.  The patient did have influenza B.  Note the patient does need a tetanus vaccine and hepatitis C screen along with colon cancer screening  Another complaint today is that of low back pain.  It is not radiating down the legs.  There is no leg weakness.  The pain is in the lumbar area and is parasternal.  A heating pad and Advil and Tylenol helped to some minimal degree.  There is no pre-existing history of back surgery.  Shortness of Breath  This is a chronic problem. The current episode started more than 1 year ago. The problem occurs daily (exertional only). The problem has been rapidly improving. Associated symptoms include rhinorrhea, sputum production and wheezing. Pertinent negatives include no chest pain, claudication, coryza, ear pain, fever, headaches, hemoptysis, leg pain, leg swelling, neck pain, orthopnea, PND, rash, sore throat, swollen glands, syncope or vomiting. The symptoms are aggravated by exercise, fumes and lying flat. Associated symptoms comments: Mucus is whiter. Risk  factors include smoking. He has tried beta agonist inhalers and ipratropium inhalers for the symptoms. The treatment provided significant relief. His past medical history is significant for COPD. There is no history of allergies, asthma, bronchiolitis, CAD, chronic lung disease, DVT, a heart failure, PE, pneumonia or a recent surgery.    Past Medical History:  Diagnosis Date  . Arthritis    HANDS & FEET  . Bronchitis   . COPD (chronic obstructive pulmonary disease) ([place])   . Influenza 03/26/2018  . Pneumonia    HX OF PNA  . Shortness of breath      Family History  Problem Relation Age of Onset  . Diabetes Mother   . Hypertension Mother   . COPD Father      Social History   Socioeconomic History  . Marital status: Married    Spouse name: Not on file  . Number of children: Not on file  . Years of education: Not on file  . Highest education level: Not on file  Occupational History  . Not on file  Social Needs  . Financial resource strain: Not on file  . Food insecurity:    Worry: Not on file    Inability: Not on file  . Transportation needs:    Medical: Not on file    Non-medical: Not on file  Tobacco Use  . Smoking status: Current Every Day Smoker  Packs/day: 0.40    Years: 30.00    Pack years: 12.00    Types: Cigarettes  . Smokeless tobacco: Never Used  . Tobacco comment: 4-5 cigs/day  Substance and Sexual Activity  . Alcohol use: Yes    Comment: beer  . Drug use: Yes    Types: Marijuana    Comment: last time 3 weeks ago  . Sexual activity: Not on file  Lifestyle  . Physical activity:    Days per week: Not on file    Minutes per session: Not on file  . Stress: Not on file  Relationships  . Social connections:    Talks on phone: Not on file    Gets together: Not on file    Attends religious service: Not on file    Active member of club or organization: Not on file    Attends meetings of clubs or organizations: Not on file    Relationship status: Not  on file  . Intimate partner violence:    Fear of current or ex partner: Not on file    Emotionally abused: Not on file    Physically abused: Not on file    Forced sexual activity: Not on file  Other Topics Concern  . Not on file  Social History Narrative  . Not on file     No Known Allergies   Outpatient Medications Prior to Visit  Medication Sig Dispense Refill  . acetaminophen (TYLENOL) 325 MG tablet Take 2 tablets (650 mg total) by mouth every 6 (six) hours as needed for mild pain (or Fever >/= 101). 30 tablet 0  . albuterol (PROVENTIL HFA;VENTOLIN HFA) 108 (90 Base) MCG/ACT inhaler Inhale 2 puffs into the lungs every 6 (six) hours as needed for wheezing or shortness of breath. 2 Inhaler 11  . ipratropium-albuterol (DUONEB) 0.5-2.5 (3) MG/3ML SOLN Take 3 mLs by nebulization every 6 (six) hours as needed. 360 mL 11  . ibuprofen (ADVIL,MOTRIN) 200 MG tablet Take 400 mg by mouth every 6 (six) hours as needed (arthritis pain).    . ondansetron (ZOFRAN) 4 MG tablet Take 1 tablet (4 mg total) by mouth every 6 (six) hours as needed for nausea. (Patient not taking: Reported on 05/17/2018) 20 tablet 0  . azithromycin (ZITHROMAX) 500 MG tablet Take 1 tablet (500 mg total) by mouth daily. (Patient not taking: Reported on 05/17/2018) 1 tablet 0  . guaiFENesin (MUCINEX) 600 MG 12 hr tablet Take 2 tablets (1,200 mg total) by mouth 2 (two) times daily. (Patient not taking: Reported on 05/17/2018) 14 tablet 0  . nicotine (NICODERM CQ - DOSED IN MG/24 HOURS) 14 mg/24hr patch Place 1 patch (14 mg total) onto the skin daily. (Patient not taking: Reported on 04/09/2018) 28 patch 0  . oseltamivir (TAMIFLU) 75 MG capsule Take 1 capsule (75 mg total) by mouth 2 (two) times daily. (Patient not taking: Reported on 04/09/2018) 4 capsule 0  . predniSONE (STERAPRED UNI-PAK 21 TAB) 10 MG (21) TBPK tablet 6 tablets on day 1, 5 tablets on day 2, 4 tablets on day 3, 3 tablets on day 4, 2 tablets on day 5, 1 tablet on day 6 and  then stop on day 7 (Patient not taking: Reported on 04/09/2018) 21 tablet 0  . tiotropium (SPIRIVA HANDIHALER) 18 MCG inhalation capsule Place 1 capsule (18 mcg total) into inhaler and inhale daily. (Patient not taking: Reported on 05/17/2018) 30 capsule 11   No facility-administered medications prior to visit.     Review of  Systems  Constitutional: Negative for fatigue and fever.  HENT: Positive for postnasal drip, rhinorrhea, sinus pressure and sneezing. Negative for ear pain, sinus pain, sore throat and trouble swallowing.   Eyes: Negative.   Respiratory: Positive for sputum production, shortness of breath and wheezing. Negative for hemoptysis.   Cardiovascular: Negative for chest pain, orthopnea, claudication, leg swelling, syncope and PND.  Gastrointestinal: Negative.  Negative for vomiting.  Genitourinary: Negative.   Musculoskeletal: Positive for back pain. Negative for neck pain.  Skin: Negative for rash.  Neurological: Negative for dizziness, tremors, seizures, weakness, light-headedness and headaches.       Objective:   Physical Exam Vitals:   05/17/18 0904  BP: (!) 149/80  Pulse: 61  Resp: 20  Weight: 130 lb (59 kg)   Wt Readings from Last 3 Encounters:  05/17/18 130 lb (59 kg)  03/26/18 126 lb 5.2 oz (57.3 kg)  05/20/13 128 lb 4.8 oz (58.2 kg)   Gen: Pleasant, thin in no distress,  normal affect  ENT: No lesions,  mouth clear,  oropharynx clear, no postnasal drip  Neck: No JVD, no TMG, no carotid bruits  Lungs: No use of accessory muscles, no dullness to percussion, distant breath sounds without wheezes  Cardiovascular: RRR, heart sounds normal, no murmur or gallops, no peripheral edema  Abdomen: soft and NT, no HSM,  BS normal  Musculoskeletal: No deformities, no cyanosis or clubbing  Neuro: alert, non focal  Skin: Warm, no lesions or rashes  CXR 3/15:   IMPRESSION: COPD.  No active disease  CBC Latest Ref Rng & Units 04/22/2018 03/28/2018 03/27/2018   WBC 3.4 - 10.8 x10E3/uL 5.9 8.8 8.6  Hemoglobin 13.0 - 17.7 g/dL 12.6(L) 12.1(L) 12.3(L)  Hematocrit 37.5 - 51.0 % 38.6 36.6(L) 38.3(L)  Platelets 150 - 450 x10E3/uL 283 211 191   BMP Latest Ref Rng & Units 04/22/2018 03/28/2018 03/27/2018  Glucose 65 - 99 mg/dL 119(H) 128(H) 129(H)  BUN 6 - 24 mg/dL '6 14 8  '$ Creatinine 0.76 - 1.27 mg/dL 0.82 0.82 0.72  BUN/Creat Ratio 9 - 20 7(L) - -  Sodium 134 - 144 mmol/L 140 134(L) 135  Potassium 3.5 - 5.2 mmol/L 4.0 4.3 4.3  Chloride 96 - 106 mmol/L 101 101 100  CO2 20 - 29 mmol/L '24 26 28  '$ Calcium 8.7 - 10.2 mg/dL 8.7 8.1(L) 8.5(L)   Hepatic Function Latest Ref Rng & Units 04/22/2018 03/28/2018 03/27/2018  Total Protein 6.0 - 8.5 g/dL 5.9(L) 5.9(L) 6.2(L)  Albumin 3.8 - 4.9 g/dL 4.2 2.7(L) 2.9(L)  AST 0 - 40 IU/L '14 15 19  '$ ALT 0 - 44 IU/L '11 16 13  '$ Alk Phosphatase 39 - 117 IU/L 51 38 37(L)  Total Bilirubin 0.0 - 1.2 mg/dL 0.5 0.6 0.6         Assessment & Plan:  I personally reviewed all images and lab data in the [facility] system as well as any outside material available during this office visit and agree with the  radiology impressions.   COPD (chronic obstructive pulmonary disease) Chronic obstructive lung disease with emphysematous component seen on imaging and active smoking use  The patient was counseled on smoking cessation and we will begin Spiriva 1 capsule daily 2 inhalations.  We sent the prescription to the our pharmacy and will enroll the patient in patient assistance  The patient will continue DuoNeb on an as-needed basis  No additional prednisone or antibiotics are indicated  Once the pulmonary function lab reopens we will obtain pulmonary  function studies at a later date  Tobacco abuse Ongoing tobacco use  Smoking cessation counseling was given to the patient  Strain of lumbar region Current neurologic exam is unremarkable there is tenderness in the low back area para spinal in the lumbar area  We will add Robaxin 500 mg 3  times daily as needed and encouraged the patient to continue Advil or Tylenol for pain  We will also get the patient back exercises   Diagnoses and all orders for this visit:  Chronic obstructive pulmonary disease, unspecified COPD type ([place]) -     Discontinue: tiotropium (SPIRIVA HANDIHALER) 18 MCG inhalation capsule; Place 1 capsule (18 mcg total) into inhaler and inhale daily. -     tiotropium (SPIRIVA HANDIHALER) 18 MCG inhalation capsule; Place 1 capsule (18 mcg total) into inhaler and inhale daily.  Colon cancer screening -     Fecal occult blood, imunochemical  Need for hepatitis C screening test -     Hepatitis C antibody  Tobacco abuse  Strain of lumbar region, initial encounter  Other orders -     methocarbamol (ROBAXIN) 500 MG tablet; Take 1 tablet (500 mg total) by mouth every 8 (eight) hours as needed for muscle spasms.   A tetanus vaccine was given to the patient and a stool card to check for blood in the stool was also given  We instructed the patient as to the proper use of the HandiHaler Spiriva

## 2018-05-18 LAB — HEPATITIS C ANTIBODY: Hep C Virus Ab: <0.1 s/co ratio (ref 0.0–0.9)

## 2018-05-21 LAB — FECAL OCCULT BLOOD, IMMUNOCHEMICAL: Fecal Occult Bld: NEGATIVE

## 2019-04-08 ENCOUNTER — Ambulatory Visit: Payer: Self-pay

## 2019-04-08 ENCOUNTER — Ambulatory Visit: Payer: Self-pay | Attending: Internal Medicine

## 2019-04-08 DIAGNOSIS — Z23 Encounter for immunization: Secondary | ICD-10-CM

## 2019-04-08 NOTE — Progress Notes (Signed)
   Covid-19 Vaccination Clinic  Name:  [name]    MRN: [id number] DOB: 12/25/1960  04/08/2019  [name] was observed post Covid-19 immunization for 15 minutes without incident. He was provided with Vaccine Information Sheet and instruction to access the V-Safe system.   [name] was instructed to call 911 with any severe reactions post vaccine: Marland Kitchen Difficulty breathing  . Swelling of face and throat  . A fast heartbeat  . A bad rash all over body  . Dizziness and weakness   Immunizations Administered    Name Date Dose VIS Date Route   Pfizer COVID-19 Vaccine 04/08/2019 11:28 AM 0.3 mL 12/24/2018 Intramuscular   Manufacturer: [employer], [employer]   Lot: [id number]   NDC: [address]-5

## 2019-05-03 ENCOUNTER — Ambulatory Visit: Payer: Self-pay | Attending: Internal Medicine

## 2019-05-03 DIAGNOSIS — Z23 Encounter for immunization: Secondary | ICD-10-CM

## 2019-05-03 NOTE — Progress Notes (Signed)
   Covid-19 Vaccination Clinic  Name:  Jarek Longton    MRN: [id number] DOB: [birth year]  05/03/2019  Mr. Zeidman was observed post Covid-19 immunization for 15 minutes without incident. He was provided with Vaccine Information Sheet and instruction to access the V-Safe system.   Mr. Campos was instructed to call 911 with any severe reactions post vaccine: Marland Kitchen Difficulty breathing  . Swelling of face and throat  . A fast heartbeat  . A bad rash all over body  . Dizziness and weakness   Immunizations Administered    Name Date Dose VIS Date Route   Pfizer COVID-19 Vaccine 05/03/2019  8:33 AM 0.3 mL 03/09/2018 Intramuscular   Manufacturer: [employer], [employer]   Lot: [id number]   NDC: [address]-3

## 2020-03-04 ENCOUNTER — Emergency Department (HOSPITAL_COMMUNITY)
Admission: EM | Admit: 2020-03-04 | Discharge: 2020-03-04 | Disposition: A | Discharge status: Home / Self Care | Payer: Self-pay | Attending: Emergency Medicine | Admitting: Emergency Medicine

## 2020-03-04 ENCOUNTER — Encounter (HOSPITAL_COMMUNITY): Payer: Self-pay | Admitting: Emergency Medicine

## 2020-03-04 ENCOUNTER — Other Ambulatory Visit: Payer: Self-pay

## 2020-03-04 ENCOUNTER — Emergency Department (HOSPITAL_COMMUNITY): Payer: Self-pay

## 2020-03-04 DIAGNOSIS — Z5321 Procedure and treatment not carried out due to patient leaving prior to being seen by health care provider: Secondary | ICD-10-CM | POA: Insufficient documentation

## 2020-03-04 DIAGNOSIS — M545 Low back pain, unspecified: Secondary | ICD-10-CM | POA: Insufficient documentation

## 2020-03-04 DIAGNOSIS — M25562 Pain in left knee: Secondary | ICD-10-CM | POA: Insufficient documentation

## 2020-03-04 NOTE — ED Triage Notes (Signed)
Pt reports L knee pain and R lower back pain.  States he was working on a house replacing the flooring and when he got back from lunch yesterday he opened the door and stepped into the house forgetting that the flooring was removed.

## 2020-05-09 IMAGING — DX CHEST - 2 VIEW
4 series · 4 of 4 positions shown · non-contrast
Comparison: 05/10/2013

CLINICAL DATA: Productive cough for 3 days. Suspected sepsis.
Heaviness in the chest. Head and abdominal pain with fever. Labored
breathing. Current smoker.

EXAM:
CHEST - 2 VIEW

[chest lat (1 of 2)]
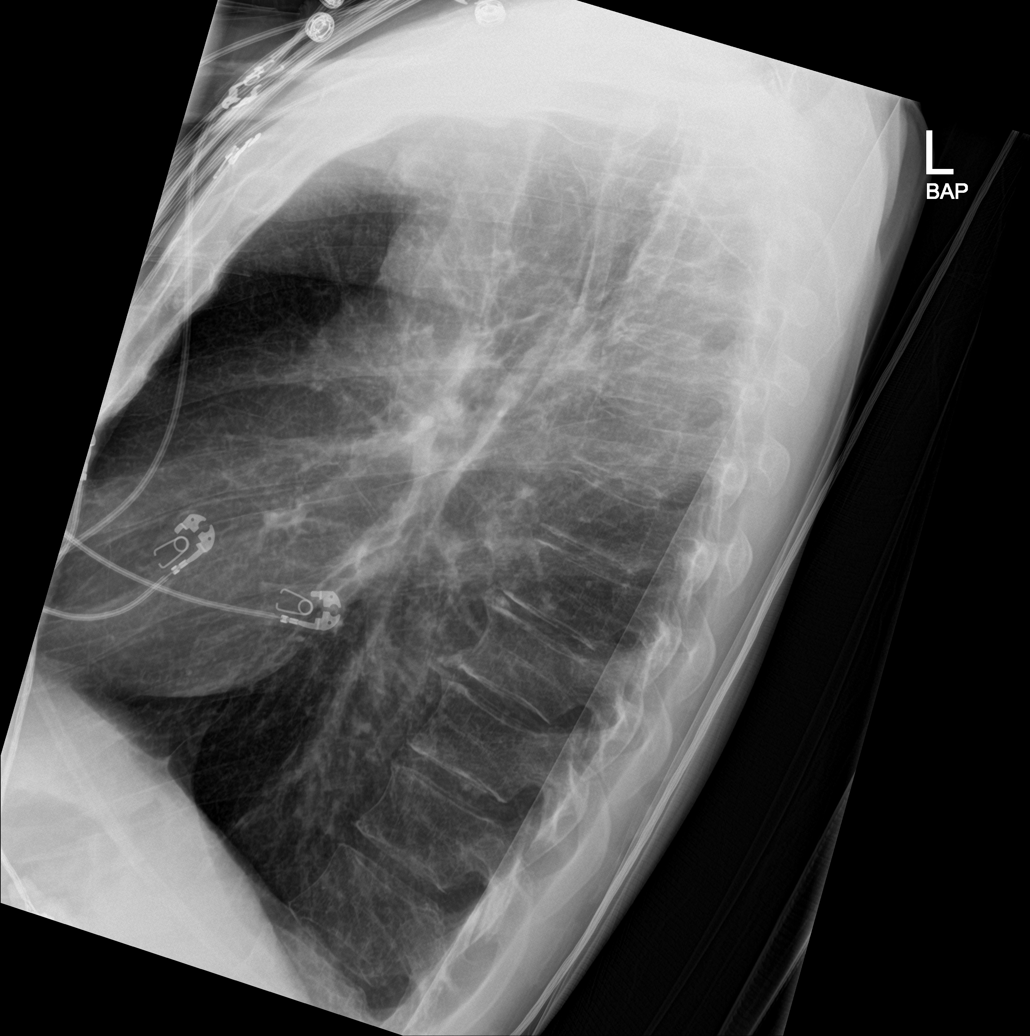

[chest ap (1 of 2)]
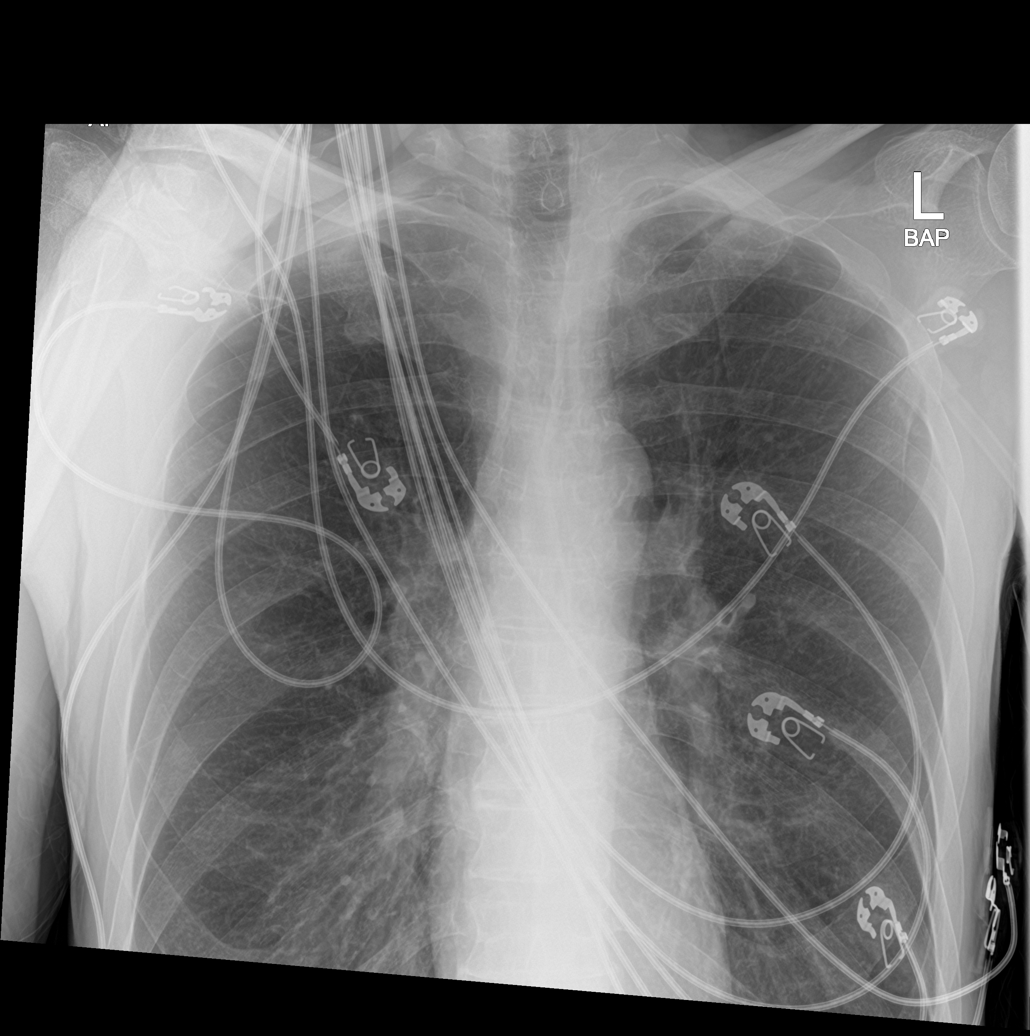

[chest ap (2 of 2)]
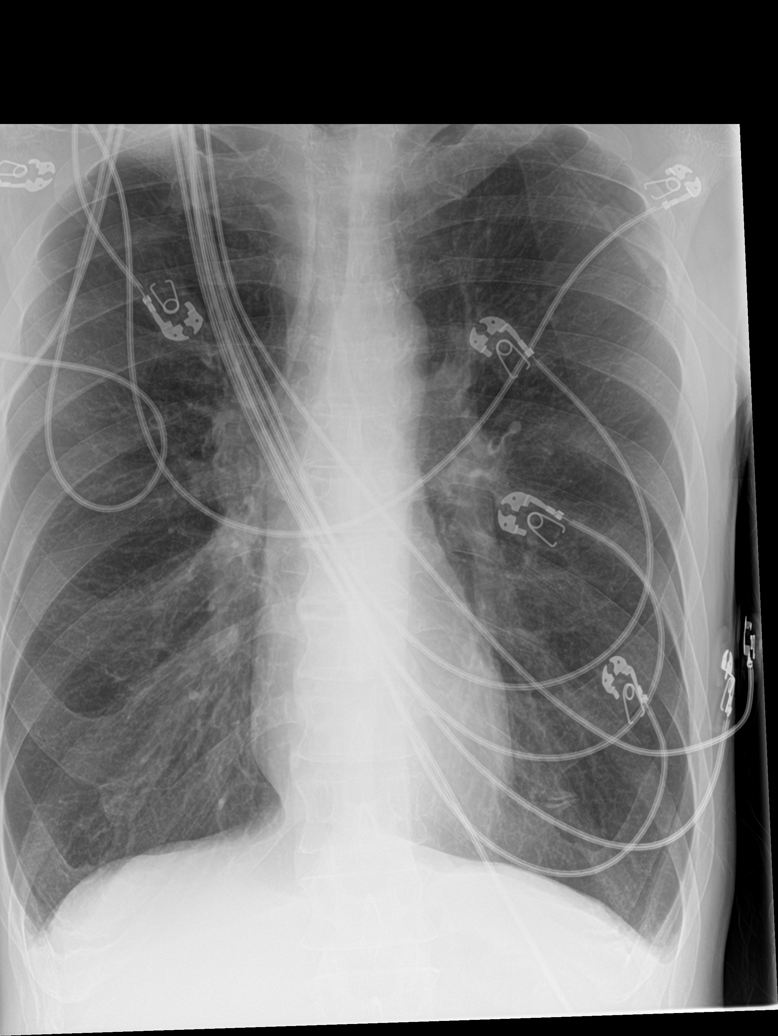

[chest lat (2 of 2)]
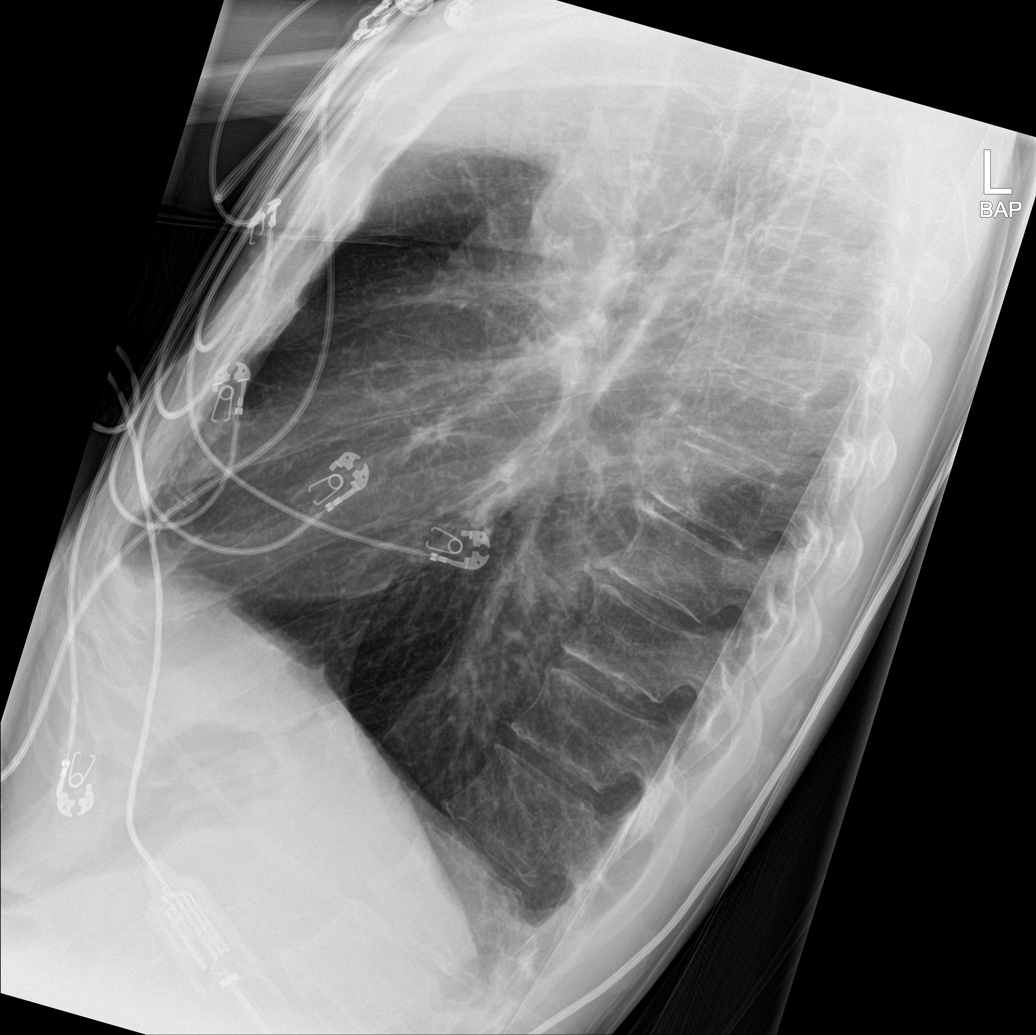

[4 of 4 positions shown; findings below may reference images not displayed]

FINDINGS: Pulmonary hyperinflation likely indicating emphysema. Peribronchial
thickening may indicate chronic bronchitis. Normal heart size and
pulmonary vascularity. No focal airspace disease or consolidation in
the lungs. No blunting of costophrenic angles. No pneumothorax.
Mediastinal contours appear intact. Degenerative changes in the
spine.
IMPRESSION: Emphysematous and chronic bronchitic changes in the lungs. No
evidence of active pulmonary disease.

## 2022-11-06 ENCOUNTER — Ambulatory Visit
Admission: EM | Admit: 2022-11-06 | Discharge: 2022-11-06 | Disposition: A | Discharge status: Home / Self Care | Payer: Medicaid Other | Attending: Internal Medicine | Admitting: Internal Medicine

## 2022-11-06 ENCOUNTER — Other Ambulatory Visit: Payer: Self-pay

## 2022-11-06 ENCOUNTER — Encounter (HOSPITAL_COMMUNITY): Payer: Self-pay | Admitting: Emergency Medicine

## 2022-11-06 ENCOUNTER — Inpatient Hospital Stay (HOSPITAL_COMMUNITY)
Admission: EM | Admit: 2022-11-06 | Discharge: 2022-11-10 | DRG: 190 | Disposition: A | Discharge status: Home / Self Care | Payer: Medicaid Other | Attending: Family Medicine | Admitting: Family Medicine

## 2022-11-06 ENCOUNTER — Emergency Department (HOSPITAL_COMMUNITY): Payer: Medicaid Other

## 2022-11-06 DIAGNOSIS — F17210 Nicotine dependence, cigarettes, uncomplicated: Secondary | ICD-10-CM | POA: Diagnosis present

## 2022-11-06 DIAGNOSIS — D509 Iron deficiency anemia, unspecified: Secondary | ICD-10-CM | POA: Diagnosis present

## 2022-11-06 DIAGNOSIS — A319 Mycobacterial infection, unspecified: Secondary | ICD-10-CM | POA: Diagnosis present

## 2022-11-06 DIAGNOSIS — Z1152 Encounter for screening for COVID-19: Secondary | ICD-10-CM

## 2022-11-06 DIAGNOSIS — J441 Chronic obstructive pulmonary disease with (acute) exacerbation: Secondary | ICD-10-CM | POA: Diagnosis not present

## 2022-11-06 DIAGNOSIS — D72829 Elevated white blood cell count, unspecified: Secondary | ICD-10-CM | POA: Diagnosis present

## 2022-11-06 DIAGNOSIS — Z758 Other problems related to medical facilities and other health care: Secondary | ICD-10-CM

## 2022-11-06 DIAGNOSIS — R0602 Shortness of breath: Secondary | ICD-10-CM

## 2022-11-06 DIAGNOSIS — Z681 Body mass index (BMI) 19 or less, adult: Secondary | ICD-10-CM

## 2022-11-06 DIAGNOSIS — Z8249 Family history of ischemic heart disease and other diseases of the circulatory system: Secondary | ICD-10-CM

## 2022-11-06 DIAGNOSIS — Z77120 Contact with and (suspected) exposure to mold (toxic): Secondary | ICD-10-CM

## 2022-11-06 DIAGNOSIS — J439 Emphysema, unspecified: Secondary | ICD-10-CM | POA: Diagnosis present

## 2022-11-06 DIAGNOSIS — R64 Cachexia: Secondary | ICD-10-CM | POA: Diagnosis present

## 2022-11-06 DIAGNOSIS — Z833 Family history of diabetes mellitus: Secondary | ICD-10-CM

## 2022-11-06 DIAGNOSIS — D649 Anemia, unspecified: Secondary | ICD-10-CM

## 2022-11-06 DIAGNOSIS — R7303 Prediabetes: Secondary | ICD-10-CM | POA: Diagnosis present

## 2022-11-06 DIAGNOSIS — E441 Mild protein-calorie malnutrition: Secondary | ICD-10-CM

## 2022-11-06 DIAGNOSIS — J9621 Acute and chronic respiratory failure with hypoxia: Principal | ICD-10-CM

## 2022-11-06 DIAGNOSIS — Z825 Family history of asthma and other chronic lower respiratory diseases: Secondary | ICD-10-CM

## 2022-11-06 LAB — COMPREHENSIVE METABOLIC PANEL
ALT: 12 U/L (ref 0–44)
AST: 15 U/L (ref 15–41)
Albumin: 3.4 g/dL — ABNORMAL LOW (ref 3.5–5.0)
Alkaline Phosphatase: 49 U/L (ref 38–126)
Anion gap: 10 (ref 5–15)
BUN: 14 mg/dL (ref 8–23)
CO2: 26 mmol/L (ref 22–32)
Calcium: 8.4 mg/dL — ABNORMAL LOW (ref 8.9–10.3)
Chloride: 102 mmol/L (ref 98–111)
Creatinine, Ser: 0.76 mg/dL (ref 0.61–1.24)
GFR, Estimated: >60 mL/min (ref >60)
Glucose, Bld: 101 mg/dL — ABNORMAL HIGH (ref 70–99)
Potassium: 4.1 mmol/L (ref 3.5–5.1)
Sodium: 138 mmol/L (ref 135–145)
Total Bilirubin: 0.6 mg/dL (ref 0.3–1.2)
Total Protein: 6.7 g/dL (ref 6.5–8.1)

## 2022-11-06 LAB — RESP PANEL BY RT-PCR (RSV, FLU A&B, COVID)  RVPGX2
Influenza A by PCR: NEGATIVE
Influenza B by PCR: NEGATIVE
Resp Syncytial Virus by PCR: NEGATIVE
SARS Coronavirus 2 by RT PCR: NEGATIVE

## 2022-11-06 LAB — CBC WITH DIFFERENTIAL/PLATELET
Abs Immature Granulocytes: 0.05 10*3/uL (ref 0.00–0.07)
Basophils Absolute: 0.1 10*3/uL (ref 0.0–0.1)
Basophils Relative: 0 %
Eosinophils Absolute: 0.1 10*3/uL (ref 0.0–0.5)
Eosinophils Relative: 1 %
HCT: 34 % — ABNORMAL LOW (ref 39.0–52.0)
Hemoglobin: 10.1 g/dL — ABNORMAL LOW (ref 13.0–17.0)
Immature Granulocytes: 0 %
Lymphocytes Relative: 9 %
Lymphs Abs: 1.2 10*3/uL (ref 0.7–4.0)
MCH: 23.8 pg — ABNORMAL LOW (ref 26.0–34.0)
MCHC: 29.7 g/dL — ABNORMAL LOW (ref 30.0–36.0)
MCV: 80 fL (ref 80.0–100.0)
Monocytes Absolute: 1 10*3/uL (ref 0.1–1.0)
Monocytes Relative: 7 %
Neutro Abs: 11.5 10*3/uL — ABNORMAL HIGH (ref 1.7–7.7)
Neutrophils Relative %: 83 %
Platelets: 366 10*3/uL (ref 150–400)
RBC: 4.25 MIL/uL (ref 4.22–5.81)
RDW: 15 % (ref 11.5–15.5)
WBC: 13.9 10*3/uL — ABNORMAL HIGH (ref 4.0–10.5)
nRBC: 0 % (ref 0.0–0.2)

## 2022-11-06 LAB — IRON AND TIBC
Iron: 10 ug/dL — ABNORMAL LOW (ref 45–182)
Saturation Ratios: 2 % — ABNORMAL LOW (ref 17.9–39.5)
TIBC: 445 ug/dL (ref 250–450)
UIBC: 435 ug/dL

## 2022-11-06 LAB — VITAMIN B12: Vitamin B-12: 259 pg/mL (ref 180–914)

## 2022-11-06 LAB — HIV ANTIBODY (ROUTINE TESTING W REFLEX): HIV Screen 4th Generation wRfx: NONREACTIVE

## 2022-11-06 LAB — FOLATE: Folate: 17.2 ng/mL (ref >5.9)

## 2022-11-06 LAB — FERRITIN: Ferritin: 9 ng/mL — ABNORMAL LOW (ref 24–336)

## 2022-11-06 LAB — BRAIN NATRIURETIC PEPTIDE: B Natriuretic Peptide: 76.3 pg/mL (ref 0.0–100.0)

## 2022-11-06 MED ORDER — FERROUS SULFATE 325 (65 FE) MG PO TABS: 325.0000 mg | ORAL_TABLET | Freq: Every day | ORAL | Status: DC

## 2022-11-06 MED ORDER — ALBUTEROL SULFATE (2.5 MG/3ML) 0.083% IN NEBU
2.5000 mg | INHALATION_SOLUTION | Freq: Once | RESPIRATORY_TRACT | Status: AC
  Administered 2022-11-06: 2.5 mg via RESPIRATORY_TRACT
  Filled 2022-11-06: qty 3

## 2022-11-06 MED ORDER — NICOTINE 21 MG/24HR TD PT24
21.0000 mg | MEDICATED_PATCH | Freq: Every day | TRANSDERMAL | Status: DC
  Administered 2022-11-06 – 2022-11-10 (×5): 21 mg via TRANSDERMAL
  Filled 2022-11-06 (×5): qty 1

## 2022-11-06 MED ORDER — PREDNISONE 10 MG PO TABS
40.0000 mg | ORAL_TABLET | Freq: Every day | ORAL | Status: DC
  Administered 2022-11-07 – 2022-11-10 (×4): 40 mg via ORAL
  Filled 2022-11-06 (×4): qty 4

## 2022-11-06 MED ORDER — IPRATROPIUM-ALBUTEROL 0.5-2.5 (3) MG/3ML IN SOLN
3.0000 mL | Freq: Four times a day (QID) | RESPIRATORY_TRACT | Status: DC
  Administered 2022-11-07: 3 mL via RESPIRATORY_TRACT
  Filled 2022-11-06: qty 3

## 2022-11-06 MED ORDER — DOXYCYCLINE HYCLATE 100 MG PO TABS
100.0000 mg | ORAL_TABLET | Freq: Once | ORAL | Status: AC
  Administered 2022-11-06: 100 mg via ORAL
  Filled 2022-11-06: qty 1

## 2022-11-06 MED ORDER — ENOXAPARIN SODIUM 40 MG/0.4ML IJ SOSY
40.0000 mg | PREFILLED_SYRINGE | INTRAMUSCULAR | Status: DC
  Administered 2022-11-06 – 2022-11-09 (×4): 40 mg via SUBCUTANEOUS
  Filled 2022-11-06 (×4): qty 0.4

## 2022-11-06 MED ORDER — IPRATROPIUM-ALBUTEROL 0.5-2.5 (3) MG/3ML IN SOLN: 3.0000 mL | Freq: Four times a day (QID) | RESPIRATORY_TRACT | Status: DC | PRN

## 2022-11-06 MED ORDER — UMECLIDINIUM-VILANTEROL 62.5-25 MCG/ACT IN AEPB
1.0000 | INHALATION_SPRAY | Freq: Every day | RESPIRATORY_TRACT | Status: DC
  Administered 2022-11-07: 1 via RESPIRATORY_TRACT
  Filled 2022-11-06: qty 14

## 2022-11-06 MED ORDER — IPRATROPIUM-ALBUTEROL 0.5-2.5 (3) MG/3ML IN SOLN
3.0000 mL | Freq: Once | RESPIRATORY_TRACT | Status: AC
  Administered 2022-11-06: 3 mL via RESPIRATORY_TRACT
  Filled 2022-11-06: qty 3

## 2022-11-06 MED ORDER — ALBUTEROL SULFATE (2.5 MG/3ML) 0.083% IN NEBU
3.0000 mL | INHALATION_SOLUTION | RESPIRATORY_TRACT | Status: DC
  Administered 2022-11-06: 3 mL via RESPIRATORY_TRACT
  Filled 2022-11-06: qty 3

## 2022-11-06 MED ORDER — ALBUTEROL SULFATE (2.5 MG/3ML) 0.083% IN NEBU
2.5000 mg | INHALATION_SOLUTION | Freq: Once | RESPIRATORY_TRACT | Status: AC
  Administered 2022-11-06: 2.5 mg via RESPIRATORY_TRACT

## 2022-11-06 MED ORDER — DOXYCYCLINE HYCLATE 100 MG PO TABS
100.0000 mg | ORAL_TABLET | Freq: Two times a day (BID) | ORAL | Status: DC
  Administered 2022-11-06 – 2022-11-10 (×8): 100 mg via ORAL
  Filled 2022-11-06 (×8): qty 1

## 2022-11-06 NOTE — ED Provider Notes (Signed)
[facility]    CSN: [id number] Arrival date & time: 11/06/22  4098      History   Chief Complaint Chief Complaint  Patient presents with   Shortness of Breath    HPI Joseph Ellis is a 62 y.o. male.   Patient presents with shortness of breath that started a few days ago but is worsened.  Reports that he has emphysema and was recently exposed to "black mold" at a job site.  He has been using his family member's albuterol inhaler with only temporary improvement in symptoms.   Shortness of Breath   Past Medical History:  Diagnosis Date   Arthritis    HANDS & FEET   Bronchitis    COPD (chronic obstructive pulmonary disease) ([facility])    Influenza 03/26/2018   Pneumonia    HX OF PNA   Shortness of breath     Patient Active Problem List   Diagnosis Date Noted   COPD exacerbation ([facility]) 11/06/2022   Strain of lumbar region 05/17/2018   Prediabetes 03/28/2018   COPD (chronic obstructive pulmonary disease) ([facility]) 05/10/2013   Tobacco abuse 05/10/2013    Past Surgical History:  Procedure Laterality Date   NO PAST SURGERIES         Home Medications    Prior to Admission medications   Medication Sig Start Date End Date Taking? Authorizing Provider  albuterol (PROVENTIL HFA;VENTOLIN HFA) 108 (90 Base) MCG/ACT inhaler Inhale 2 puffs into the lungs every 6 (six) hours as needed for wheezing or shortness of breath. 04/09/18  Yes Fulp, Cammie, MD  naproxen sodium (ALEVE) 220 MG tablet Take 220 mg by mouth 2 (two) times daily as needed (pain).    [provider]  Pseudoeph-Doxylamine-DM-APAP (NYQUIL PO) Take 15 mLs by mouth every 6 (six) hours as needed (cough, sleep).    [provider]    Family History Family History  Problem Relation Age of Onset   Diabetes Mother    Hypertension Mother    COPD Father     Social History Social History   Tobacco Use   Smoking status: Every Day    Current packs/day: 0.40    Average packs/day:  0.4 packs/day for 30.0 years (12.0 ttl pk-yrs)    Types: Cigarettes   Smokeless tobacco: Never   Tobacco comments:    4-5 cigs/day  Vaping Use   Vaping status: Never Used  Substance Use Topics   Alcohol use: Yes    Comment: beer   Drug use: Yes    Types: Marijuana    Comment: last time 3 weeks ago     Allergies   Patient has no known allergies.   Review of Systems Review of Systems Per HPI  Physical Exam Triage Vital Signs ED Triage Vitals [11/06/22 0951]  Encounter Vitals Group     BP 121/73     Systolic BP Percentile      Diastolic BP Percentile      Pulse Rate 62     Resp (!) 32     Temp 97.9 F (36.6 C)     Temp Source Oral     SpO2 93 %     Weight      Height      Head Circumference      Peak Flow      Pain Score      Pain Loc      Pain Education      Exclude from Growth Chart  No data found.  Updated Vital Signs BP 121/73 (BP Location: Right Arm)   Pulse 62   Temp 97.9 F (36.6 C) (Oral)   Resp (!) 36   SpO2 98% Comment: post neb tx  Visual Acuity Right Eye Distance:   Left Eye Distance:   Bilateral Distance:    Right Eye Near:   Left Eye Near:    Bilateral Near:     Physical Exam Constitutional:      General: He is in acute distress.     Appearance: Normal appearance. He is not toxic-appearing or diaphoretic.  HENT:     Head: Normocephalic and atraumatic.  Eyes:     Extraocular Movements: Extraocular movements intact.     Conjunctiva/sclera: Conjunctivae normal.  Cardiovascular:     Rate and Rhythm: Normal rate and regular rhythm.     Pulses: Normal pulses.     Heart sounds: Normal heart sounds.  Pulmonary:     Breath sounds: Wheezing and rhonchi present.     Comments: Tachypnea noted.  Neurological:     General: No focal deficit present.     Mental Status: He is alert and oriented to person, place, and time. Mental status is at baseline.  Psychiatric:        Mood and Affect: Mood normal.        Behavior: Behavior normal.         Thought Content: Thought content normal.        Judgment: Judgment normal.      UC Treatments / Results  Labs (all labs ordered are listed, but only abnormal results are displayed) Labs Reviewed - No data to display  EKG   Radiology CT CHEST WO CONTRAST  Result Date: 11/07/2022 CLINICAL DATA:  Interstitial lung disease EXAM: CT CHEST WITHOUT CONTRAST TECHNIQUE: Multidetector CT imaging of the chest was performed following the standard protocol without IV contrast. RADIATION DOSE REDUCTION: This exam was performed according to the departmental dose-optimization program which includes automated exposure control, adjustment of the mA and/or kV according to patient size and/or use of iterative reconstruction technique. COMPARISON:  Chest CT dated March 19, 2021 FINDINGS: Cardiovascular: Normal heart size. No pericardial effusion. Normal caliber thoracic aorta with severe calcified plaque. Severe coronary artery calcifications. Mediastinum/Nodes: Small hiatal hernia. Thyroid is unremarkable. No enlarged lymph nodes seen in the chest. Small tracheal diverticulum. Lungs/Pleura: Central airways are patent. Small amount of layering bubbly debris seen in the posterior trachea. Unchanged biapical pleural-parenchymal scarring. Moderate bilateral bronchial wall thickening and scattered areas of mucous plugging. Severe emphysema. Numerous new centrilobular nodules which are most pronounced in the bilateral lower lobes and posterior right upper lobe. Some previously seen pulmonary nodules appear to have resolved, for example anterior right upper lobe, however findings are overall worsened. No pleural effusion or pneumothorax. Upper Abdomen: Punctate nonobstructing left renal stone. Musculoskeletal: No chest wall mass or suspicious bone lesions identified. IMPRESSION: 1. Bronchial wall thickening and waxing and waning nodularity which is overall worsened when compared with the prior exam, likely due to  chronic atypical infection such as non tuberculous mycobacterial or recurrent aspiration. 2. Small amount of layering bubbly debris seen in the posterior trachea, consistent with aspiration. 3. Coronary artery calcifications, aortic Atherosclerosis (ICD10-I70.0) and Emphysema (ICD10-J43.9). Electronically Signed   By: Allegra Lai M.D.   On: 11/07/2022 12:12   DG Chest 2 View  Result Date: 11/06/2022 CLINICAL DATA:  Cough and dyspnea EXAM: CHEST - 2 VIEW COMPARISON:  X-ray 03/19/2021 and CT scan  without contrast FINDINGS: Hyperinflation. No pneumothorax, effusion or edema. Normal cardiopericardial silhouette. Calcified aorta. Overlapping cardiac leads. Chronic interstitial lung changes. IMPRESSION: Hyperinflation with chronic changes. Electronically Signed   By: Karen Kays M.D.   On: 11/06/2022 13:06    Procedures Procedures (including critical care time)  Medications Ordered in UC Medications  albuterol (PROVENTIL) (2.5 MG/3ML) 0.083% nebulizer solution 2.5 mg (2.5 mg Nebulization Given 11/06/22 0959)    Initial Impression / Assessment and Plan / UC Course  I have reviewed the triage vital signs and the nursing notes.  Pertinent labs & imaging results that were available during my care of the patient were reviewed by me and considered in my medical decision making (see chart for details).     Patient is significantly tachypneic with adventitious lung sounds on exam and low oxygen so recommended to patient that he go to the emergency department today for further evaluation and management.  He was agreeable with plan.  Patient left via EMS transport. Albuterol nebulizer treatment administered prior to discharge. Final Clinical Impressions(s) / UC Diagnoses   Final diagnoses:  Shortness of breath   Discharge Instructions   None    ED Prescriptions   None    PDMP not reviewed this encounter.   Gustavus Bryant, [place] 11/07/22 1524

## 2022-11-06 NOTE — ED Triage Notes (Signed)
Pt SOB, dyspnea at rest with accessory muscle use. States hx of emphysema. Used family member's inhaler pta with minimal relief. States he was exposed to black mold on job site 2 days ago and since then breathing has been worse. [place], [place] in to evaluate pt during triage

## 2022-11-06 NOTE — ED Notes (Signed)
EMS at bedside

## 2022-11-06 NOTE — Progress Notes (Deleted)
FMTS Brief Progress Note  S: On interview, patient laying in bed.  No acute distress.  Endorses feeling depressed mood over the past 2 years.  Endorses hearing "a voice" earlier this morning, which said "and what's that to you" and called her a name.  Denied suicidal plan/intent.   O: BP 123/63   Pulse 71   Temp 97.9 F (36.6 C) (Oral)   Resp (!) 22   Ht 5\' 9"  (1.753 m)   Wt 58.5 kg   SpO2 (!) 88%   BMI 19.05 kg/m    Physical exam Constitutional: NAD, thin appearing woman lying in bed, attentive to interview. Cardiovascular: RRR, no M/R/G.  On 3 L oxygen, no iWoB Respiratory: CTAB. Abdominal: Soft MSK: ROM intact.  Strength appropriate to age/situation Neuro: No gross deficits. Psych: Appropriate mood, depressed affect   A/P: Acute on chronic respiratory failure with hypoxia - Continue Doxy/prednisone/inhalers per day team.  On 3 L of oxygen.  No increased work of breathing - If condition changes, plan includes PRN inhaler/increasing oxygen.  Concern for suicidality, AVH - Endorses isolated incident of auditory hallucination, noncommand earlier in the day.  Nothing since then. - Endorses chronic depression, no active SI with intent or plan. - No acute need for one-to-one sitter at this time, if patient exhibits continued psychotic symptomatology, we will reevaluate. - Remainder of plan per day team.  Tomie China, MD 11/06/2022, 7:58 PM PGY-1, [facility] Night Resident  Please page [phone] with questions.

## 2022-11-06 NOTE — ED Notes (Signed)
Per doctors request, I walked with the pt down the hallway to RR5 and back to his room. By the time the pt made it to pass room 23 on the way back his O2 saturation was 82. I asked Amber the paramedic at the time in [place] to come into the room and she said " place the pt on four liters of oxygen and if the saturation does not improve to place the pt pon five liters." The nurse for the pt was notified about this when we left the room, she said that she would head in when.

## 2022-11-06 NOTE — Progress Notes (Addendum)
FMTS Brief Progress Note  S: On interview, patient sitting up in bed.  No increased work of breathing.  No new complaints.  Asked for something to drink.  Aware that he can call night team with new complaints.  O: BP 124/60 (BP Location: Left Arm)   Pulse 74   Temp 97.7 F (36.5 C) (Oral)   Resp 19   Ht 5\' 9"  (1.753 m)   Wt 58.5 kg   SpO2 95%   BMI 19.05 kg/m    Physical exam Constitutional: NAD, thin appearing man laying in bed, attentive to interview. Cardiovascular: RRR, no M/R/G. Respiratory: Expiratory wheezing in bilateral anterior lung fields Abdominal: BS+, nontender to light palpation in 4 quadrants. MSK: ROM intact.  Strength 5/5 in all extremities. Neuro: No gross deficits. Psych: Appropriate mood and affect.  A/P: Acute on chronic respiratory failure with hypoxia/COPD exacerbation - Continue doxycycline twice daily, DuoNebs, oral prednisone, Ellipta. - Rest of plan per day team. - If condition changes, plan includes increasing DuoNeb frequency, albuterol PRNs.   Joseph Martinez, MD 11/06/2022, 8:34 PM PGY-1, [facility] Night Resident  Please page [phone] with questions.

## 2022-11-06 NOTE — ED Notes (Signed)
Patient is being discharged from the Urgent Care and sent to the Emergency Department via EMS . Per Ervin Knack, NP, patient is in need of higher level of care due to SOB. Patient is aware and verbalizes understanding of plan of care.  Vitals:   11/06/22 0954 11/06/22 1007  BP:    Pulse:    Resp:  (!) 36  Temp:    SpO2: 92% 98%

## 2022-11-06 NOTE — Assessment & Plan Note (Addendum)
Since mold exposure, pt has had increased sputum production for the past 2 days which is "green and black" in color. Afebrile. Has had night sweats. Has ben using his home albuterol inhaler without typical relief of symptoms. He is s/p albuterol neb x2, solumedrol, and duoneb with some improvement in breathing though still requiring 3L via [place]. - Continue doxycycline twice daily as above - Prednisone 40 mg daily - Duonebs as above - Anoro Ellipta 1 puff daily

## 2022-11-06 NOTE — Hospital Course (Addendum)
Joseph Ellis is a 61 y.o.male with a history of arthritis, COPD smoking 1 pack/day since 62 years old, history of pneumonia who was admitted to the family medicine teaching Service at [facility] for dyspnea. His hospital course is detailed below:  Dyspnea COPD exacerbation? Mycobacterium Infection Patient reports open "mold exposure" as the cause of his increased sputum production over the last couple of days.  Chest x-ray with hyperinflation but otherwise negative.  Treated the patient for COPD exacerbation with prednisone and antibiotics, doxycycline. CT showed evidence of nodules suspicious for NTM (non-tuberculosis mycobacterium), Pulm was consulted for sputum culture and treatment optimization. They initiated triple therapy, to which the patient responded well. On discharged he was tolerating RA, and lung exams were improved.  Sputum cultures pending and will need to be followed up outpatient.  Initial routine culture showed only respiratory flora.  Malnutrition  Seemingly cachectic on admission. Could be related to chronic conditions or living situation.   Other chronic conditions were medically managed with home medications and formulary alternatives as necessary (Anemia)  PCP Follow-up Recommendations: LDCT outpatient IV iron outpatient Colonoscopy outpatient Follow-up outpatient pulmonologist in 2 to 3 weeks Fu with [facility] on 10/31, [name] will be PCP

## 2022-11-06 NOTE — ED Notes (Signed)
ED TO INPATIENT HANDOFF REPORT  ED Nurse Name and Phone #: Grover Canavan 29  S Name/Age/Gender Joseph Ellis 62 y.o. male Room/Bed: 026C/026C  Code Status   Code Status: Full Code  Home/SNF/Other Home Patient oriented to: self, place, time, and situation Is this baseline? Yes   Triage Complete: Triage complete  Chief Complaint COPD exacerbation (HCC) [J44.1]  Triage Note No notes on file   Allergies No Known Allergies  Level of Care/Admitting Diagnosis ED Disposition     ED Disposition  Admit   Condition  --   Comment  Hospital Area: [facility] [[id number]]  Level of Care: Med-Surg [16]  May place patient in observation at [facility] or [facility] if equivalent level of care is available:: No  Covid Evaluation: Asymptomatic - no recent exposure (last 10 days) testing not required  Diagnosis: COPD exacerbation [facility]) [[id number]]  Admitting Physician: Cyndia Skeeters [[id number]]  Attending Physician: Caro Laroche [[id number]]          B Medical/Surgery History Past Medical History:  Diagnosis Date   Arthritis    HANDS & FEET   Bronchitis    COPD (chronic obstructive pulmonary disease) (HCC)    Influenza 03/26/2018   Pneumonia    HX OF PNA   Shortness of breath    Past Surgical History:  Procedure Laterality Date   NO PAST SURGERIES       A IV Location/Drains/Wounds Patient Lines/Drains/Airways Status     Active Line/Drains/Airways     Name Placement date Placement time Site Days   Peripheral IV 11/06/22 20 G 1" Right Antecubital 11/06/22  1011  Antecubital  less than 1            Intake/Output Last 24 hours No intake or output data in the 24 hours ending 11/06/22 1719  Labs/Imaging Results for orders placed or performed during the hospital encounter of 11/06/22 (from the past 48 hour(s))  CBC with Differential     Status: Abnormal   Collection Time: 11/06/22 11:34 AM  Result Value Ref Range   WBC 13.9 (H) 4.0 - 10.5  K/uL   RBC 4.25 4.22 - 5.81 MIL/uL   Hemoglobin 10.1 (L) 13.0 - 17.0 g/dL   HCT 41.3 (L) 24.4 - 01.0 %   MCV 80.0 80.0 - 100.0 fL   MCH 23.8 (L) 26.0 - 34.0 pg   MCHC 29.7 (L) 30.0 - 36.0 g/dL   RDW 27.2 53.6 - 64.4 %   Platelets 366 150 - 400 K/uL   nRBC 0.0 0.0 - 0.2 %   Neutrophils Relative % 83 %   Neutro Abs 11.5 (H) 1.7 - 7.7 K/uL   Lymphocytes Relative 9 %   Lymphs Abs 1.2 0.7 - 4.0 K/uL   Monocytes Relative 7 %   Monocytes Absolute 1.0 0.1 - 1.0 K/uL   Eosinophils Relative 1 %   Eosinophils Absolute 0.1 0.0 - 0.5 K/uL   Basophils Relative 0 %   Basophils Absolute 0.1 0.0 - 0.1 K/uL   Immature Granulocytes 0 %   Abs Immature Granulocytes 0.05 0.00 - 0.07 K/uL    Comment: Performed at [facility], [address] [place], [place] [address]  Comprehensive metabolic panel     Status: Abnormal   Collection Time: 11/06/22 11:34 AM  Result Value Ref Range   Sodium 138 135 - 145 mmol/L   Potassium 4.1 3.5 - 5.1 mmol/L   Chloride 102 98 - 111 mmol/L   CO2 26  22 - 32 mmol/L   Glucose, Bld 101 (H) 70 - 99 mg/dL    Comment: Glucose reference range applies only to samples taken after fasting for at least 8 hours.   BUN 14 8 - 23 mg/dL   Creatinine, Ser 1.61 0.61 - 1.24 mg/dL   Calcium 8.4 (L) 8.9 - 10.3 mg/dL   Total Protein 6.7 6.5 - 8.1 g/dL   Albumin 3.4 (L) 3.5 - 5.0 g/dL   AST 15 15 - 41 U/L   ALT 12 0 - 44 U/L   Alkaline Phosphatase 49 38 - 126 U/L   Total Bilirubin 0.6 0.3 - 1.2 mg/dL   GFR, Estimated >09 >60 mL/min    Comment: (NOTE) Calculated using the CKD-EPI Creatinine Equation (2021)    Anion gap 10 5 - 15    Comment: Performed at [facility], [address] [place], [place] [address]  Resp panel by RT-PCR (RSV, Flu A&B, Covid) Anterior Nasal Swab     Status: None   Collection Time: 11/06/22 11:34 AM   Specimen: Anterior Nasal Swab  Result Value Ref Range   SARS Coronavirus 2 by RT PCR NEGATIVE NEGATIVE   Influenza A by PCR NEGATIVE NEGATIVE    Influenza B by PCR NEGATIVE NEGATIVE    Comment: (NOTE) The Xpert Xpress SARS-CoV-2/FLU/RSV plus assay is intended as an aid in the diagnosis of influenza from Nasopharyngeal swab specimens and should not be used as a sole basis for treatment. Nasal washings and aspirates are unacceptable for Xpert Xpress SARS-CoV-2/FLU/RSV testing.  Fact Sheet for Patients: BloggerCourse.com  Fact Sheet for Healthcare Providers: SeriousBroker.it  This test is not yet approved or cleared by the [place] FDA and has been authorized for detection and/or diagnosis of SARS-CoV-2 by FDA under an Emergency Use Authorization (EUA). This EUA will remain in effect (meaning this test can be used) for the duration of the COVID-19 declaration under Section 564(b)(1) of the Act, 21 U.S.C. section 360bbb-3(b)(1), unless the authorization is terminated or revoked.     Resp Syncytial Virus by PCR NEGATIVE NEGATIVE    Comment: (NOTE) Fact Sheet for Patients: BloggerCourse.com  Fact Sheet for Healthcare Providers: SeriousBroker.it  This test is not yet approved or cleared by the [place] FDA and has been authorized for detection and/or diagnosis of SARS-CoV-2 by FDA under an Emergency Use Authorization (EUA). This EUA will remain in effect (meaning this test can be used) for the duration of the COVID-19 declaration under Section 564(b)(1) of the Act, 21 U.S.C. section 360bbb-3(b)(1), unless the authorization is terminated or revoked.  Performed at [facility], [address], [place], [place] [address]   Brain natriuretic peptide     Status: None   Collection Time: 11/06/22 11:35 AM  Result Value Ref Range   B Natriuretic Peptide 76.3 0.0 - 100.0 pg/mL    Comment: Performed at [facility], [address] [place], [place] [address]   DG Chest 2 View  Result Date:  11/06/2022 CLINICAL DATA:  Cough and dyspnea EXAM: CHEST - 2 VIEW COMPARISON:  X-ray 03/19/2021 and CT scan without contrast FINDINGS: Hyperinflation. No pneumothorax, effusion or edema. Normal cardiopericardial silhouette. Calcified aorta. Overlapping cardiac leads. Chronic interstitial lung changes. IMPRESSION: Hyperinflation with chronic changes. Electronically Signed   By: Karen Kays M.D.   On: 11/06/2022 13:06    Pending Labs Unresulted Labs (From admission, onward)     Start     Ordered   11/07/22 0500  Basic metabolic panel  Tomorrow morning,   R        11/06/22 1543   11/06/22 1709  Ferritin  Once,   R        11/06/22 1708   11/06/22 1709  Iron and TIBC  Once,   R        11/06/22 1708   11/06/22 1709  Vitamin B12  Once,   R        11/06/22 1708   11/06/22 1709  Folate  Once,   R        11/06/22 1708   11/06/22 1540  HIV Antibody (routine testing w rflx)  (HIV Antibody (Routine testing w reflex) panel)  Once,   R        11/06/22 1543            Vitals/Pain Today's Vitals   11/06/22 1101 11/06/22 1415 11/06/22 1500 11/06/22 1549  BP:  109/60 (!) 117/54   Pulse: 74 61 71   Resp:  18 (!) 25   Temp: 97.9 F (36.6 C)   97.8 F (36.6 C)  TempSrc: Oral   Oral  SpO2: 96% 96% 90%   Weight:      Height:      PainSc:        Isolation Precautions No active isolations  Medications Medications  doxycycline (VIBRA-TABS) tablet 100 mg (has no administration in time range)  doxycycline (VIBRA-TABS) tablet 100 mg (has no administration in time range)  predniSONE (DELTASONE) tablet 40 mg (has no administration in time range)  umeclidinium-vilanterol (ANORO ELLIPTA) 62.5-25 MCG/ACT 1 puff (has no administration in time range)  enoxaparin (LOVENOX) injection 40 mg (has no administration in time range)  albuterol (PROVENTIL) (2.5 MG/3ML) 0.083% nebulizer solution 3 mL (has no administration in time range)  ipratropium-albuterol (DUONEB) 0.5-2.5 (3) MG/3ML nebulizer solution 3  mL (3 mLs Nebulization Given 11/06/22 1131)  albuterol (PROVENTIL) (2.5 MG/3ML) 0.083% nebulizer solution 2.5 mg (2.5 mg Nebulization Given 11/06/22 1130)  ipratropium-albuterol (DUONEB) 0.5-2.5 (3) MG/3ML nebulizer solution 3 mL (3 mLs Nebulization Given 11/06/22 1412)  albuterol (PROVENTIL) (2.5 MG/3ML) 0.083% nebulizer solution 2.5 mg (2.5 mg Nebulization Given 11/06/22 1412)    Mobility walks     Focused Assessments Pulmonary Assessment Handoff:  Lung sounds: Bilateral Breath Sounds: Expiratory wheezes L Breath Sounds: Expiratory wheezes R Breath Sounds: Expiratory wheezes O2 Device: Nasal Cannula O2 Flow Rate (L/min): 2 L/min    R Recommendations: See Admitting Provider Note  Report given to:   Additional Notes:

## 2022-11-06 NOTE — ED Provider Notes (Signed)
[place] EMERGENCY DEPARTMENT AT [facility] Provider Note   CSN: [id number] Arrival date & time: 11/06/22  1053     History  Chief Complaint  Patient presents with   Shortness of Breath    Arrived via EMS from urgent care for c/o shortness of breath. Urgent care reports room air saturation of 82%. Wheezing and tachypneic. Pt states he was doing house work two days ago and was inhaling sheet rock and other products.    Joseph Ellis is a 62 y.o. male.  HPI 62 year old male with a history of emphysema presents with shortness of breath.  He has been having cough and shortness of breath for couple days.  He thinks he had a fever though he does not know how high.  He had a little bit of sore throat this improving.  No chest pain or leg swelling.  He has a chronic cough but now the cough is worse and has green and black sputum.  Feels like prior episodes of emphysema.  Was given albuterol neb at urgent care and his sats were low so EMS was called and he was brought here. EMS gave him a dose of solumedrol.  Home Medications Prior to Admission medications   Medication Sig Start Date End Date Taking? Authorizing Provider  albuterol (PROVENTIL HFA;VENTOLIN HFA) 108 (90 Base) MCG/ACT inhaler Inhale 2 puffs into the lungs every 6 (six) hours as needed for wheezing or shortness of breath. 04/09/18  Yes Fulp, Cammie, MD  naproxen sodium (ALEVE) 220 MG tablet Take 220 mg by mouth 2 (two) times daily as needed (pain).   Yes [provider]  Pseudoeph-Doxylamine-DM-APAP (NYQUIL PO) Take 15 mLs by mouth every 6 (six) hours as needed (cough, sleep).   Yes [provider]      Allergies    Patient has no known allergies.    Review of Systems   Review of Systems  Constitutional:  Positive for fever (subjective).  HENT:  Positive for sore throat.   Respiratory:  Positive for cough and shortness of breath.   Cardiovascular:  Negative for chest pain and leg swelling.     Physical Exam Updated Vital Signs BP (!) 117/54   Pulse 71   Temp 97.9 F (36.6 C) (Oral)   Resp (!) 25   Ht 5\' 9"  (1.753 m)   Wt 58.5 kg   SpO2 90%   BMI 19.05 kg/m  Physical Exam Vitals and nursing note reviewed.  Constitutional:      General: He is not in acute distress.    Appearance: He is well-developed. He is not ill-appearing or diaphoretic.  HENT:     Head: Normocephalic and atraumatic.  Cardiovascular:     Rate and Rhythm: Normal rate and regular rhythm.     Heart sounds: Normal heart sounds.  Pulmonary:     Effort: Pulmonary effort is normal. Tachypnea present. No accessory muscle usage.     Breath sounds: Wheezing present.  Abdominal:     Palpations: Abdomen is soft.     Tenderness: There is no abdominal tenderness.  Skin:    General: Skin is warm and dry.  Neurological:     Mental Status: He is alert.     ED Results / Procedures / Treatments   Labs (all labs ordered are listed, but only abnormal results are displayed) Labs Reviewed  CBC WITH DIFFERENTIAL/PLATELET - Abnormal; Notable for the following components:      Result Value   WBC 13.9 (*)  Hemoglobin 10.1 (*)    HCT 34.0 (*)    MCH 23.8 (*)    MCHC 29.7 (*)    Neutro Abs 11.5 (*)    All other components within normal limits  COMPREHENSIVE METABOLIC PANEL - Abnormal; Notable for the following components:   Glucose, Bld 101 (*)    Calcium 8.4 (*)    Albumin 3.4 (*)    All other components within normal limits  RESP PANEL BY RT-PCR (RSV, FLU A&B, COVID)  RVPGX2  BRAIN NATRIURETIC PEPTIDE  HIV ANTIBODY (ROUTINE TESTING W REFLEX)    EKG EKG Interpretation Date/Time:  Thursday November 06 2022 10:59:50 EDT Ventricular Rate:  76 PR Interval:  138 QRS Duration:  86 QT Interval:  392 QTC Calculation: 408 R Axis:   70  Text Interpretation: Sinus rhythm Paired ventricular premature complexes Probable anteroseptal infarct, old Confirmed by Pricilla Loveless [phone]) on 11/06/2022 11:06:31  AM  Radiology DG Chest 2 View  Result Date: 11/06/2022 CLINICAL DATA:  Cough and dyspnea EXAM: CHEST - 2 VIEW COMPARISON:  X-ray 03/19/2021 and CT scan without contrast FINDINGS: Hyperinflation. No pneumothorax, effusion or edema. Normal cardiopericardial silhouette. Calcified aorta. Overlapping cardiac leads. Chronic interstitial lung changes. IMPRESSION: Hyperinflation with chronic changes. Electronically Signed   By: Karen Kays M.D.   On: 11/06/2022 13:06    Procedures Procedures    Medications Ordered in ED Medications  doxycycline (VIBRA-TABS) tablet 100 mg (has no administration in time range)  doxycycline (VIBRA-TABS) tablet 100 mg (has no administration in time range)  predniSONE (DELTASONE) tablet 40 mg (has no administration in time range)  umeclidinium-vilanterol (ANORO ELLIPTA) 62.5-25 MCG/ACT 1 puff (has no administration in time range)  enoxaparin (LOVENOX) injection 40 mg (has no administration in time range)  ipratropium-albuterol (DUONEB) 0.5-2.5 (3) MG/3ML nebulizer solution 3 mL (3 mLs Nebulization Given 11/06/22 1131)  albuterol (PROVENTIL) (2.5 MG/3ML) 0.083% nebulizer solution 2.5 mg (2.5 mg Nebulization Given 11/06/22 1130)  ipratropium-albuterol (DUONEB) 0.5-2.5 (3) MG/3ML nebulizer solution 3 mL (3 mLs Nebulization Given 11/06/22 1412)  albuterol (PROVENTIL) (2.5 MG/3ML) 0.083% nebulizer solution 2.5 mg (2.5 mg Nebulization Given 11/06/22 1412)    ED Course/ Medical Decision Making/ A&P                                 Medical Decision Making Amount and/or Complexity of Data Reviewed Labs: ordered.    Details: Mildly elevated white count Radiology: ordered and independent interpretation performed.    Details: No lobar pneumonia ECG/medicine tests: ordered and independent interpretation performed.    Details: No ischemia  Risk Prescription drug management. Decision regarding hospitalization.   Patient appears to have a COPD exacerbation.  He seems  to be improving with DuoNebs.  He was already given Solu-Medrol by EMS.  While he felt better, he was ambulated and unfortunately dropped his O2 sats into the low 80s so he was put on oxygen and will need admission.  I still think this COPD +/- atypical infection.  Will need admission.  Discussed with family practice for admission.        Final Clinical Impression(s) / ED Diagnoses Final diagnoses:  Acute on chronic respiratory failure with hypoxia (HCC)  COPD exacerbation (HCC)    Rx / DC Orders ED Discharge Orders     None         Pricilla Loveless, MD 11/06/22 1545

## 2022-11-06 NOTE — H&P (Addendum)
Hospital Admission History and Physical Service Pager: [phone]  Patient name: Joseph Ellis Medical record number: [id number] Date of Birth: 17-Aug-1960 Age: 62 y.o. Gender: male  Primary Care Provider: Cain Saupe, MD Consultants: None Code Status: FULL Preferred Emergency Contact:  Brother - Tyquez Toutant [phone]   Chief Complaint: Shortness of breath  Assessment and Plan: [name] is a 62 y.o. male presenting from urgent care with shortness of breath. Differential for presentation of this includes COPD exacerbation, PE, asthma exacerbation, CHF, pneumothorax, arrhythmia, anemia.   Strongly suspect COPD exacerbation given patient's history as well as multiple triggers for this including smoking 1 pack/day, environmental triggers related to work, recent fluctuations in temperature/cold weather, possible respiratory illness/mold exposure.  Notably, on respiratory panel patient is negative for COVID and influenza.  Remains suspicious for possible bacterial cause though patient is afebrile, he does have mild leukocytosis. Some concern for anemia as a contributing factor.  CBC reveals hemoglobin 10.1.  No obvious sources of bleeding at this time. Chest x-ray without evidence of pneumothorax, pneumonia, pleural effusion, or edema.  Physical exam nonconcerning for LE DVT. EKG with sinus rhythm, no evidence of arrhythmia. 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  Strongly suspect COPD exacerbation as below.  He is s/p single dose doxycycline in the ED. - Admit to FMTS, Med-Surg, attending Dr. Linwood Dibbles - Vital signs per floor, continuous pulse ox - Continue antibiotics: Doxycycline every 12 hours for 9 doses -Duonebs Q6H scheduled, can de-escalate as clinical status improves - Prednisone 40 mg daily - Anoro Ellipta 1 puff daily -  AM BMP - Fall precautions - PT/OT to treat COPD exacerbation (HCC) Since mold exposure, pt has had increased sputum production for the past 2 days which is "green and black" in color. Afebrile. Has had night sweats. Has ben using his home albuterol inhaler without typical relief of symptoms. He is s/p albuterol neb x2, solumedrol, and duoneb with some improvement in breathing though still requiring 3L via [place]. - Continue doxycycline twice daily as above - Prednisone 40 mg daily - Duonebs as above - Anoro Ellipta 1 puff daily Anemia, unspecified type Hgb 10.1 on admission. No obvious signs of bleeding on exam. History of IDA. - AM CBC, iron studies, folate, vitamin B12 Malnutrition of mild degree (HCC) Patient does appear somewhat cachectic.  He reports he has always been very skinny, somewhere around 130 pounds.  He weighs 58.5 kg (130lbs) on admission. Do question possible B symptoms/malignancy in the setting of recent night sweats, however less concern for weight loss given pt reports this is his lifelong baseline. -Nutrition consult, appreciate recommendations -Consider further workup for malignancy Does not have primary care provider Will ask about becoming a patient of [facility].  Chronic and Stable Problems: none  FEN/GI: Heart diet VTE Prophylaxis: Lovenox  Disposition: Med-surg  History of Present Illness:  Joseph Ellis is a 62 y.o. male presenting with SOB.  Last week, he worked on a job as a Education administrator where mold was present. He has been having SOB since Tuesday afternoon. He notes he will get like this often when he works in those environments. He uses albuterol as needed, most of the time at work. This morning, he could "hardly breathe." He endorses cough and phlegm at baseline, but it is much worse now. Cough has woken him  up from sleep. His phlegm is greenish-black and higher volume.  He has also had a headache and took aleve. He has been taking Nyquil as well. Since this  started, he has felt a heaviness in his chest. No stomach pain. No diarrhea. No O2 at home. Since Tuesday has had some night sweats but no known fevers. No n/v. No known weight changes. States he typically weighs around 130-135lbs. He has been hospitalized once before with COPD in 03/2018 with bronchitis and PNA.  Patient initially seen at urgent care where he received albuterol nebulizer and was found to be "significantly tachypneic with adventitious lung sounds" so was recommended to present to the ED today.  Patient transported via EMS who administered a dose of Solu-Medrol en route.  In the ED, CXR with hyperinflation but no evidence of other acute abnormality.  He received additional albuterol nebulizer and DuoNeb as well.  ED also administered 100 mg one-time dose for suspicion of possible PNA. He feels better now after treatment.  Review Of Systems: Per HPI.  Pertinent Past Medical History: Arthritis COPD (bronchitis, emphysema) History of pneumonia Remainder reviewed in history tab.   Pertinent Past Surgical History: None  Remainder reviewed in history tab.   Pertinent Social History: Tobacco use: 1 ppd since 62 years old Alcohol use: Socially Other Substance use: None now but had marijuana and crack 4-5 years ago Lives with his two brothers, and they help care for him. No assistive devices to walk.  Pertinent Family History: Mother-diabetes, hypertension Father-COPD Remainder reviewed in history tab.   Important Outpatient Medications: Albuterol inhaler 2 puffs every 6 hours as needed Remainder reviewed in medication history.   Objective: BP (!) 118/55   Pulse 61   Temp 98.1 F (36.7 C) (Oral)   Resp (!) 22   Ht 5\' 9"  (1.753 m)   Wt 58.5 kg   SpO2 99%   BMI 19.05 kg/m  Exam: General: Alert, pleasant, cachectic-appearing, no acute distress. HEENT: normocephalic, PERRLA, EOM grossly intact, MMM. Cardio: RRR, no murmurs Pulm: Bilateral coarse breath sounds with  expiratory wheeze.  On 3 L via nasal cannula. Abdominal: Normoactive bowel sounds present, soft, non-tender, non-distended. Extremities: no peripheral edema.  Bilateral lower extremities without erythema, nontender to palpation, intact pedal pulses 2+ bilaterally.  Moves all extremities equally. Neuro: Alert and oriented, speech normal in content, no facial asymmetry. Psych:  Cognition and judgment appear intact. Alert, communicative, and cooperative with normal attention span and concentration.   Labs:  CBC BMET  Recent Labs  Lab 11/06/22 1134  WBC 13.9*  HGB 10.1*  HCT 34.0*  PLT 366   Recent Labs  Lab 11/06/22 1134  NA 138  K 4.1  CL 102  CO2 26  BUN 14  CREATININE 0.76  GLUCOSE 101*  CALCIUM 8.4*     Respiratory panel negative for COVID, influenza BNP WNL  EKG: Regular rate, regular rhythm, P waves present before every QRS, no evidence of PR interval prolongation.  No evidence of ST segment elevations.  Imaging Studies Performed: 10/24 CXR: Hyperinflation with chronic changes.   I have personally reviewed the imaging above and agree with the radiologist read.   Cyndia Skeeters, DO 11/06/2022, 5:47 PM PGY-1, [facility]  FPTS Intern pager: [phone], text pages welcome Secure chat group [facility] Teaching Service   I agree with the assessment and plan as documented in the resident's note.  Janeal Holmes, MD [facility]

## 2022-11-06 NOTE — Plan of Care (Signed)

## 2022-11-07 ENCOUNTER — Telehealth: Payer: Self-pay | Admitting: Student

## 2022-11-07 ENCOUNTER — Observation Stay (HOSPITAL_COMMUNITY): Payer: Medicaid Other

## 2022-11-07 DIAGNOSIS — Z681 Body mass index (BMI) 19 or less, adult: Secondary | ICD-10-CM | POA: Diagnosis not present

## 2022-11-07 DIAGNOSIS — D509 Iron deficiency anemia, unspecified: Secondary | ICD-10-CM | POA: Diagnosis present

## 2022-11-07 DIAGNOSIS — F17210 Nicotine dependence, cigarettes, uncomplicated: Secondary | ICD-10-CM | POA: Diagnosis present

## 2022-11-07 DIAGNOSIS — A319 Mycobacterial infection, unspecified: Secondary | ICD-10-CM | POA: Diagnosis present

## 2022-11-07 DIAGNOSIS — R64 Cachexia: Secondary | ICD-10-CM | POA: Diagnosis present

## 2022-11-07 DIAGNOSIS — Z1152 Encounter for screening for COVID-19: Secondary | ICD-10-CM | POA: Diagnosis not present

## 2022-11-07 DIAGNOSIS — J441 Chronic obstructive pulmonary disease with (acute) exacerbation: Secondary | ICD-10-CM

## 2022-11-07 DIAGNOSIS — Z77120 Contact with and (suspected) exposure to mold (toxic): Secondary | ICD-10-CM | POA: Diagnosis not present

## 2022-11-07 DIAGNOSIS — Z825 Family history of asthma and other chronic lower respiratory diseases: Secondary | ICD-10-CM | POA: Diagnosis not present

## 2022-11-07 DIAGNOSIS — J439 Emphysema, unspecified: Secondary | ICD-10-CM | POA: Diagnosis present

## 2022-11-07 DIAGNOSIS — D72829 Elevated white blood cell count, unspecified: Secondary | ICD-10-CM | POA: Diagnosis present

## 2022-11-07 DIAGNOSIS — Z833 Family history of diabetes mellitus: Secondary | ICD-10-CM | POA: Diagnosis not present

## 2022-11-07 DIAGNOSIS — J9621 Acute and chronic respiratory failure with hypoxia: Secondary | ICD-10-CM | POA: Diagnosis present

## 2022-11-07 DIAGNOSIS — E441 Mild protein-calorie malnutrition: Secondary | ICD-10-CM | POA: Diagnosis present

## 2022-11-07 DIAGNOSIS — Z8249 Family history of ischemic heart disease and other diseases of the circulatory system: Secondary | ICD-10-CM | POA: Diagnosis not present

## 2022-11-07 DIAGNOSIS — R0602 Shortness of breath: Secondary | ICD-10-CM | POA: Diagnosis present

## 2022-11-07 DIAGNOSIS — R7303 Prediabetes: Secondary | ICD-10-CM | POA: Diagnosis present

## 2022-11-07 LAB — EXPECTORATED SPUTUM ASSESSMENT W GRAM STAIN, RFLX TO RESP C

## 2022-11-07 LAB — BASIC METABOLIC PANEL
Anion gap: 10 (ref 5–15)
BUN: 13 mg/dL (ref 8–23)
CO2: 25 mmol/L (ref 22–32)
Calcium: 8.8 mg/dL — ABNORMAL LOW (ref 8.9–10.3)
Chloride: 100 mmol/L (ref 98–111)
Creatinine, Ser: 0.65 mg/dL (ref 0.61–1.24)
GFR, Estimated: >60 mL/min (ref >60)
Glucose, Bld: 98 mg/dL (ref 70–99)
Potassium: 4 mmol/L (ref 3.5–5.1)
Sodium: 135 mmol/L (ref 135–145)

## 2022-11-07 MED ORDER — FERROUS SULFATE 325 (65 FE) MG PO TABS
325.0000 mg | ORAL_TABLET | ORAL | Status: DC
Start: 2022-11-07 — End: 2022-11-10
  Administered 2022-11-07 – 2022-11-09 (×2): 325 mg via ORAL
  Filled 2022-11-07 (×2): qty 1

## 2022-11-07 MED ORDER — ENSURE ENLIVE PO LIQD
237.0000 mL | Freq: Two times a day (BID) | ORAL | Status: DC
  Administered 2022-11-07: 237 mL via ORAL

## 2022-11-07 MED ORDER — IPRATROPIUM-ALBUTEROL 0.5-2.5 (3) MG/3ML IN SOLN
3.0000 mL | Freq: Four times a day (QID) | RESPIRATORY_TRACT | Status: DC | PRN
  Administered 2022-11-09: 3 mL via RESPIRATORY_TRACT
  Filled 2022-11-07: qty 3

## 2022-11-07 MED ORDER — REVEFENACIN 175 MCG/3ML IN SOLN
175.0000 ug | Freq: Every day | RESPIRATORY_TRACT | Status: DC
Start: 2022-11-07 — End: 2022-11-10
  Administered 2022-11-08 – 2022-11-10 (×3): 175 ug via RESPIRATORY_TRACT
  Filled 2022-11-07 (×4): qty 3

## 2022-11-07 MED ORDER — BUDESONIDE 0.25 MG/2ML IN SUSP
0.2500 mg | Freq: Two times a day (BID) | RESPIRATORY_TRACT | Status: DC
  Administered 2022-11-07 – 2022-11-10 (×5): 0.25 mg via RESPIRATORY_TRACT
  Filled 2022-11-07 (×5): qty 2

## 2022-11-07 MED ORDER — ARFORMOTEROL TARTRATE 15 MCG/2ML IN NEBU
15.0000 ug | INHALATION_SOLUTION | Freq: Two times a day (BID) | RESPIRATORY_TRACT | Status: DC
  Administered 2022-11-07 – 2022-11-10 (×5): 15 ug via RESPIRATORY_TRACT
  Filled 2022-11-07 (×5): qty 2

## 2022-11-07 NOTE — Progress Notes (Signed)
Initial Nutrition Assessment  DOCUMENTATION CODES:   Not applicable  INTERVENTION:  -Liberalized diet -Ensure Enlive po BID, each supplement provides 350 kcal and 20 grams of protein.    NUTRITION DIAGNOSIS:   Increased nutrient needs related to chronic illness as evidenced by estimated needs.    GOAL:   Patient will meet greater than or equal to 90% of their needs    MONITOR:   PO intake, Weight trends, Labs, Supplement acceptance  REASON FOR ASSESSMENT:   Consult Assessment of nutrition requirement/status  ASSESSMENT:  21 y.yo M observation, Acute on chronic respiratory failure with hypoxia/COPD exacerbation.  Past medical history of SOB, COPD, Arthritis, bronchitis. RD reached out via phone although there was no answer.  All information obtained through team via secure chat and EMR.  Review of EMR revealed no significant weight, appetite or chewing/swallowing changes. Patient reported UBW 130-135# this has been base line most of his life.  There is no benefit to excessive dietary restrictions related to advanced age and increased nutrient needs.  Patient would benefit better from liberalized diet and Ensure Enlive po BID, each supplement provides 350 kcal and 20 grams of protein. To help better meet increased nutrient needs.   Admit weight: 58.5 kg Current weight: 58.5 kg Weight history;  11/06/22 58.5 kg  05/17/18 59 kg  03/26/18 57.3 kg  05/20/13 58.2 kg  05/11/13 57.1 kg       Average Meal Intake: No current documentation.   Nutritionally Relevant Medications: Scheduled Meds:  doxycycline  100 mg Oral Q12H   ferrous sulfate  325 mg Oral QODAY   nicotine  21 mg Transdermal Daily    Labs Reviewed: CBG ranges from 101-98 mg/dL over the last 24 hours    NUTRITION - FOCUSED PHYSICAL EXAM:  Deferred;   Diet Order:   Diet Order             Diet regular Room service appropriate? Yes; Fluid consistency: Thin  Diet effective now                    EDUCATION NEEDS:   No education needs have been identified at this time  Skin:  Skin Assessment: Reviewed RN Assessment  Last BM:  PTA  Height:   Ht Readings from Last 1 Encounters:  11/06/22 5\' 9"  (1.753 m)    Weight:   Wt Readings from Last 1 Encounters:  11/06/22 58.5 kg    Ideal Body Weight:     BMI:  Body mass index is 19.05 kg/m.  Estimated Nutritional Needs:   Kcal:  1800-2100 kcal/day  Protein:  80-90 g/d  Fluid:  67ml/kcal    Jamelle Haring RDN, LDN Clinical Dietitian  RDN pager # available on Amion

## 2022-11-07 NOTE — Progress Notes (Signed)
Daily Progress Note Intern Pager: [phone]  Patient name: Joseph Ellis Medical record number: [id number] Date of birth: 06/08/1960 Age: 62 y.o. Gender: male  Primary Care Provider: Cain Saupe, MD Consultants: None Code Status: Full  Pt Overview and Major Events to Date:  1024: Admitted to FM TS  Assessment and Plan:  Joseph Ellis is a 62 year old man with a history of COPD, tobacco use and prediabetes presenting to the hospital with acute COPD exacerbation.  This morning he is improving, still requiring oxygen support.  We will continue with supportive care. Assessment & Plan COPD exacerbation [facility]) Patient remains afebrile, tolerating current treatment plan well.  CT was ordered and done this morning to follow-up on a previous CT from about a year ago that noted a possible indolent chronic infection, and given patient's cachectic appearance and chronically ill nature of this could be the underlying cause for his ongoing issues. - Continue doxycycline twice daily as above - Prednisone 40 mg daily - Duonebs as above - Anoro Ellipta 1 puff daily -Follow-up CT -Wean O2 as appropriate   Chronic and Stable Problems: Anemia: Oral iron supplementation with ferrous sulfate 325 mg every other day   FEN/GI: Regular PPx: Lovenox Dispo:Home pending clinical improvement .   Subjective:  Patient was resting comfortably in bed on exam this morning.  He states that he is starting to feel much better.  He had already been taken for his CT scan when I spoke to him this morning.  Objective: Temp:  [97.6 F (36.4 C)-98.1 F (36.7 C)] 97.6 F (36.4 C) (10/25 0748) Pulse Rate:  [61-74] 72 (10/25 0748) Resp:  [18-36] 20 (10/25 0443) BP: (109-137)/(54-74) 124/59 (10/25 0748) SpO2:  [88 %-100 %] 95 % (10/25 0748) Weight:  [58.5 kg] 58.5 kg (10/24 1057) Physical Exam: General: Alert, oriented.  Cachectic, no obvious distress Cardiovascular: RRR, no M/R/G Respiratory: Diffuse  rhonchi, no increased work of breathing Abdomen: Flat, soft, nontender Extremities: 2+ peripheral pulses in all 4 extremities warm well-perfused  Laboratory: Most recent CBC Lab Results  Component Value Date   WBC 13.9 (H) 11/06/2022   HGB 10.1 (L) 11/06/2022   HCT 34.0 (L) 11/06/2022   MCV 80.0 11/06/2022   PLT 366 11/06/2022   Most recent BMP    Latest Ref Rng & Units 11/07/2022    7:38 AM  BMP  Glucose 70 - 99 mg/dL 98   BUN 8 - 23 mg/dL 13   Creatinine 5.36 - 1.24 mg/dL 6.44   Sodium 034 - 742 mmol/L 135   Potassium 3.5 - 5.1 mmol/L 4.0   Chloride 98 - 111 mmol/L 100   CO2 22 - 32 mmol/L 25   Calcium 8.9 - 10.3 mg/dL 8.8     Joseph Heck, DO 11/07/2022, 8:17 AM  PGY-1, [facility] FPTS Intern pager: [phone], text pages welcome Secure chat group [facility] Teaching Service

## 2022-11-07 NOTE — Assessment & Plan Note (Signed)
Patient remains afebrile, tolerating current treatment plan well.  CT was ordered and done this morning to follow-up on a previous CT from about a year ago that noted a possible indolent chronic infection, and given patient's cachectic appearance and chronically ill nature of this could be the underlying cause for his ongoing issues. - Continue doxycycline twice daily as above - Prednisone 40 mg daily - Duonebs as above - Anoro Ellipta 1 puff daily -Follow-up CT -Wean O2 as appropriate

## 2022-11-07 NOTE — Plan of Care (Signed)

## 2022-11-07 NOTE — Consult Note (Signed)
NAME:  Joseph Ellis, MRN:  [id number], DOB:  12-Feb-1960, LOS: 0 ADMISSION DATE:  11/06/2022, CONSULTATION DATE:  11/07/2022 REFERRING MD:  internal medicine, CHIEF COMPLAINT:  COPD   History of Present Illness:  62 year old male with past medical history of COPD, arthritis who initially presented to [facility] on 11/06/2022 with shortness of breath. Per EDP, arrived from urgent care. At [facility], room air sats 82%. She was wheezing, tachypneic. Subjective report of possible fever at home, and generally feeling unwell for about two days prior to arrival. In ED, CXR with hyperinflation with no obvious focal opacity. Labs with WBC 13.9, hgb 10.1, CMP unremarkable, covid/flu/rsv negative, BNP 76. He was given multiple duonebs. Felt better; however, had ambulatory O2 low 80s and placed on O2 and admitted. Started on oral doxycycline and steroids. 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 CT was performed and showed "1. bronchial wall thickening and waxing and waning nodularity which is overall worsened when compared with the prior exam, likely due to chronic atypical infection such as non tuberculous mycobacterial or recurrent aspiration. 2. Small amount of layering bubbly debris seen in the posterior trachea, consistent with aspiration." Pulm was consulted for recommendations of possible non-tuberculous mycobacterial infection.   On my exam, cachectic appearing male. On 3LNC, mildly tachypneic. He basically endorses story as above. Notes he does not wear O2 or CPAP at home. Continues to smoke 1ppd. 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 He does not wear a mask at work. No recent travel or sick contacts. Denies recent weight loss. Denies coughing after eating.  Pertinent  Medical History  COPD, arthritis  Significant Hospital Events: Including procedures, antibiotic start and  stop dates in addition to other pertinent events   10/24: admitted  10/25: CT chest with possible atypical, nonTB mycobacterial infection. Pulm consult  Interim History / Subjective:  Feels better than on admission. Continues to cough green/black sputum.   Objective   Blood pressure (!) 124/59, pulse 68, temperature 97.6 F (36.4 C), temperature source Oral, resp. rate 18, height 5\' 9"  (1.753 m), weight 58.5 kg, SpO2 98%.        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 Unsure of actually chronicity of these exacerbations that seem to be brought on by working at construction sites and possible mold exposure. COVID/flu/rsv negative. Although CT notes possible area of aspiration, he does not seem to have symptoms of this. Primary team with concern of MAC infection. HIV negative.  - sputum culture  - send AFB (not for TB) - if he were to have MAC infection will need prolonged, multidrug course of therapy. Does not have PCP. Will need to have this set up regardless.  - wean  O2 with SpO2 goal 90% - can continue doxycycline  - con't steroids for 5 days  - will place on triple therapy: budesonide, brovana, yupelri  - IS, flutter valve  - will need follow-up in pulm clinic. Will set this up for 2-3 weeks post-discharge   - counsel on smoking cessation   Best Practice (right click and "Reselect all SmartList Selections" daily)  Below per primary team  Diet/type:  DVT prophylaxis:  GI prophylaxis:  Lines:  Foley:   Code Status:   Last date of multidisciplinary goals of care discussion []   Labs   CBC: Recent Labs  Lab 11/06/22 1134  WBC 13.9*  NEUTROABS 11.5*  HGB 10.1*  HCT 34.0*  MCV 80.0  PLT 366    Basic Metabolic Panel: Recent Labs  Lab 11/06/22 1134 11/07/22 0738  NA 138 135  K 4.1 4.0  CL 102 100  CO2 26 25  GLUCOSE 101* 98  BUN 14 13  CREATININE 0.76 0.65  CALCIUM 8.4* 8.8*   GFR: Estimated Creatinine Clearance: 77.2 mL/min (by C-G formula based on SCr of 0.65 mg/dL). Recent Labs  Lab 11/06/22 1134  WBC 13.9*    Liver Function Tests: Recent Labs  Lab 11/06/22 1134  AST 15  ALT 12  ALKPHOS 49  BILITOT 0.6  PROT 6.7  ALBUMIN 3.4*   No results for input(s): "LIPASE", "AMYLASE" in the last 168 hours. No results for input(s): "AMMONIA" in the last 168 hours.  ABG    Component Value Date/Time   HCO3 24.4 03/25/2018 2235   TCO2 26 03/25/2018 2235   ACIDBASEDEF 1.0 03/25/2018 2235   O2SAT 56.0 03/25/2018 2235     Coagulation Profile: No results for input(s): "INR", "PROTIME" in the last 168 hours.  Cardiac Enzymes: No results for input(s): "CKTOTAL", "CKMB", "CKMBINDEX", "TROPONINI" in the last 168 hours.  HbA1C: Hgb A1c MFr Bld  Date/Time Value Ref Range Status  03/28/2018 03:22 AM 5.7 (H) 4.8 - 5.6 % Final    Comment:    (NOTE) Pre diabetes:          5.7%-6.4% Diabetes:              >6.4% Glycemic control for   <7.0% adults with diabetes     CBG: No results for input(s): "GLUCAP" in the last 168 hours.  Review of Systems:   As above  Past Medical History:  He,  has a past medical history of Arthritis, Bronchitis, COPD (chronic obstructive pulmonary disease) (HCC), Influenza (03/26/2018), Pneumonia, and Shortness of breath.   Surgical  History:   Past Surgical History:  Procedure Laterality Date   NO PAST SURGERIES       Social History:   reports that he has been smoking cigarettes. He has a 12 pack-year smoking history. He has never used smokeless tobacco. He reports current alcohol use. He reports current drug use. Drug: Marijuana.   Family History:  His family history includes COPD in his father; Diabetes in his mother; Hypertension in his mother.   Allergies No Known Allergies   Home Medications  Prior to Admission medications   Medication Sig Start Date End Date Taking? Authorizing Provider  albuterol (PROVENTIL HFA;VENTOLIN HFA) 108 (90 Base) MCG/ACT inhaler Inhale 2 puffs into the lungs every 6 (six) hours as needed for wheezing or shortness of breath. 04/09/18  Yes Fulp, Cammie, MD  naproxen sodium (ALEVE) 220 MG tablet Take 220 mg by mouth 2 (two) times daily as needed (pain).   Yes [provider]  Pseudoeph-Doxylamine-DM-APAP (NYQUIL PO) Take 15 mLs by mouth every 6 (six) hours as needed (cough, sleep).   Yes [provider]     Critical care time:     Her[facility] 11/07/2022, 12:40 PM  Please see [email or web address] for pager details.  From 7A-7P if no response, please call [phone]. After hours, please call ELink [phone].

## 2022-11-08 DIAGNOSIS — J441 Chronic obstructive pulmonary disease with (acute) exacerbation: Secondary | ICD-10-CM | POA: Diagnosis not present

## 2022-11-08 NOTE — Assessment & Plan Note (Addendum)
Patient remains afebrile, tolerating treatment outlined by pulmonology well.  CT scan done yesterday showed worsened areas of nodularity in the left upper lobe of the lung consistent with previous studies.  Pulmonology was consulted yesterday and is following.  Recommendations as below. -Sputum cultures obtained, pending  -Continue as needed DuoNebs - Pulmonology following, recs as below:  -Begin budesonide, Brovana, Yupelri  -incentive spirometry, flutter valve  -Continue doxycycline  -Continue steroids for 5 total days  -Follow-up outpatient pulm clinic, in 2 to 3 weeks postdischarge -Droplet precautions

## 2022-11-08 NOTE — Progress Notes (Signed)
Daily Progress Note Intern Pager: [phone]  Patient name: Joseph Ellis Medical record number: [id number] Date of birth: October 26, 1960 Age: 62 y.o. Gender: male  Primary Care Provider: Cain Saupe, MD Consultants: Pulmonology Code Status: Full code  Pt Overview and Major Events to Date:  10/24: Admitted to FM TS  Assessment and Plan: Mattia Nazaryan is a 62 year old man with a history of COPD, tobacco use and prediabetes presenting to the hospital with acute COPD exacerbation now with suspected chronic lung infection.   Assessment & Plan COPD exacerbation (HCC) Tolerated oxygen wean this morning, will continue to wean as appropriate.  Wheezing noted upon exam today, would benefit from additional breathing treatment and encourage patient to use prn treatments.  Given likely chronic underlying lung disease, possible patient may require home oxygen particularly with exertion. -Sputum cultures pending -Wean oxygen as tolerated -Continue DuoNebs prn -Pulmonology following, appreciate recs -Continue Budesonide, Rosalyn Gess, Yupelri -Doxycycline and prednisone x 5 days -Ambulatory O2 prior to discharge   Chronic and Stable Problems:  Anemia: Oral iron supplementation every other day, recommend outpatient colonoscopy    FEN/GI: Regular PPx: Lovenox Dispo: Home pending respiratory improvement  Subjective:  Patient assessed at bedside, resting comfortably without increased work of breathing.  Reports continued cough and sputum production.  Otherwise normal intake and output  Objective: Temp:  [97.9 F (36.6 C)-98.2 F (36.8 C)] 98.2 F (36.8 C) (10/26 2118) Pulse Rate:  [65-78] 70 (10/26 2118) Resp:  [18-19] 18 (10/26 2118) BP: (108-128)/(62-70) 128/69 (10/26 2118) SpO2:  [93 %-98 %] 98 % (10/26 2118) Physical Exam: General: Appears older than stated age, no acute distress Cardiovascular: RRR without murmur Respiratory: Diffuse coarse breath sounds with some end  expiratory wheezing noted.  Normal work of breathing on 3 L nasal cannula Abdomen: Soft, nontender, nondistended Extremities: No peripheral edema  Laboratory: Most recent CBC Lab Results  Component Value Date   WBC 13.9 (H) 11/06/2022   HGB 10.1 (L) 11/06/2022   HCT 34.0 (L) 11/06/2022   MCV 80.0 11/06/2022   PLT 366 11/06/2022   Most recent BMP    Latest Ref Rng & Units 11/07/2022    7:38 AM  BMP  Glucose 70 - 99 mg/dL 98   BUN 8 - 23 mg/dL 13   Creatinine 1.47 - 1.24 mg/dL 8.29   Sodium 562 - 130 mmol/L 135   Potassium 3.5 - 5.1 mmol/L 4.0   Chloride 98 - 111 mmol/L 100   CO2 22 - 32 mmol/L 25   Calcium 8.9 - 10.3 mg/dL 8.8     Sputum culture results pending, rare gram-positive cocci and rods  Imaging/Diagnostic Tests: None of the past 24 hours  Elberta Fortis, MD 11/08/2022, 11:46 PM  PGY-2, [place] [facility] FPTS Intern pager: [phone], text pages welcome Secure chat group [facility] Teaching Service

## 2022-11-08 NOTE — Plan of Care (Signed)
  Problem: Education: Goal: Knowledge of disease or condition will improve Outcome: Progressing Goal: Knowledge of the prescribed therapeutic regimen will improve Outcome: Progressing Goal: Individualized Educational Video(s) Outcome: Progressing   Problem: Activity: Goal: Ability to tolerate increased activity will improve Outcome: Progressing Goal: Will verbalize the importance of balancing activity with adequate rest periods Outcome: Progressing   Problem: Respiratory: Goal: Ability to maintain a clear airway will improve Outcome: Progressing Goal: Levels of oxygenation will improve Outcome: Progressing Goal: Ability to maintain adequate ventilation will improve Outcome: Progressing   Problem: Education: Goal: Knowledge of General Education information will improve Description: Including pain rating scale, medication(s)/side effects and non-pharmacologic comfort measures Outcome: Progressing   Problem: Health Behavior/Discharge Planning: Goal: Ability to manage health-related needs will improve Outcome: Progressing   Problem: Clinical Measurements: Goal: Ability to maintain clinical measurements within normal limits will improve Outcome: Progressing Goal: Will remain free from infection Outcome: Progressing Goal: Diagnostic test results will improve Outcome: Progressing Goal: Respiratory complications will improve Outcome: Progressing Goal: Cardiovascular complication will be avoided Outcome: Progressing   Problem: Activity: Goal: Risk for activity intolerance will decrease Outcome: Progressing   Problem: Nutrition: Goal: Adequate nutrition will be maintained Outcome: Progressing   Problem: Coping: Goal: Level of anxiety will decrease Outcome: Progressing   Problem: Elimination: Goal: Will not experience complications related to bowel motility Outcome: Progressing Goal: Will not experience complications related to urinary retention Outcome: Progressing    Problem: Pain Management: Goal: General experience of comfort will improve Outcome: Progressing   Problem: Safety: Goal: Ability to remain free from injury will improve Outcome: Progressing   Problem: Skin Integrity: Goal: Risk for impaired skin integrity will decrease Outcome: Progressing

## 2022-11-08 NOTE — Assessment & Plan Note (Signed)
-  Sputum cultures pending -Continue DuoNebs prn -Pulmonology following, appreciate recs -Continue Budesonide, Rosalyn Gess, Yupelri -Doxycycline and prednisone x 5 days

## 2022-11-08 NOTE — Progress Notes (Signed)
Daily Progress Note Intern Pager: [phone]  Patient name: Joseph Ellis Medical record number: [id number] Date of birth: 1960/07/08 Age: 62 y.o. Gender: male  Primary Care Provider: Cain Saupe, MD Consultants: Pulmonology Code Status: Full  Pt Overview and Major Events to Date:  10/24: Admitted to [facility].   Assessment and Plan:  Joseph Ellis is a 62 year old man with a history of COPD, tobacco use and prediabetes presenting to the hospital with acute COPD exacerbation now with suspected chronic lung infection.  This morning he is feeling better, sitting up in bed still requires oxygen support.  Pulmonology is following and will provide recommendations. Assessment & Plan COPD exacerbation [facility]) Patient remains afebrile, tolerating treatment outlined by pulmonology well.  CT scan done yesterday showed worsened areas of nodularity in the left upper lobe of the lung consistent with previous studies.  Pulmonology was consulted yesterday and is following.  Recommendations as below. -Sputum cultures obtained, pending  -Continue as needed DuoNebs - Pulmonology following, recs as below:  -Begin budesonide, Brovana, Yupelri  -incentive spirometry, flutter valve  -Continue doxycycline  -Continue steroids for 5 total days  -Follow-up outpatient pulm clinic, in 2 to 3 weeks postdischarge -Droplet precautions   Chronic and Stable Problems:  Anemia: Oral iron supplementation every other day, recommend outpatient colonoscopy   FEN/GI: Regular PPx: Lovenox Dispo:Home pending clinical improvement .   Subjective:  Patient awake sitting up in bed eating breakfast on evaluation this morning.  He states that he is feeling a lot better than he did yesterday with the new treatments.  He is coughing up more sputum with the incentive spirometry and flutter valve provided by pulmonology.  Overall he appears much more energetic today as well.  Objective: Temp:  [97.4 F (36.3 C)-97.9 F  (36.6 C)] 97.9 F (36.6 C) (10/26 0553) Pulse Rate:  [65-73] 65 (10/26 0553) Resp:  [18-19] 19 (10/26 0553) BP: (108-125)/(59-72) 108/70 (10/26 0553) SpO2:  [94 %-98 %] 97 % (10/26 0553) Physical Exam: General: Cachectic but well appearing Cardiovascular: RRR, no M/R/G Respiratory: Coarse crackles in all lung fields that clear with cough.  Better air movement today. Abdomen: Flat, soft, nontender. Extremities: 2+ pulses in all 4 extremities.  Laboratory: Most recent CBC Lab Results  Component Value Date   WBC 13.9 (H) 11/06/2022   HGB 10.1 (L) 11/06/2022   HCT 34.0 (L) 11/06/2022   MCV 80.0 11/06/2022   PLT 366 11/06/2022   Most recent BMP    Latest Ref Rng & Units 11/07/2022    7:38 AM  BMP  Glucose 70 - 99 mg/dL 98   BUN 8 - 23 mg/dL 13   Creatinine 6.96 - 1.24 mg/dL 2.95   Sodium 284 - 132 mmol/L 135   Potassium 3.5 - 5.1 mmol/L 4.0   Chloride 98 - 111 mmol/L 100   CO2 22 - 32 mmol/L 25   Calcium 8.9 - 10.3 mg/dL 8.8    Sputum cultures pending  CT chest without contrast IMPRESSION: 1. Bronchial wall thickening and waxing and waning nodularity which is overall worsened when compared with the prior exam, likely due to chronic atypical infection such as non tuberculous mycobacterial or recurrent aspiration. 2. Small amount of layering bubbly debris seen in the posterior trachea, consistent with aspiration. 3. Coronary artery calcifications, aortic Atherosclerosis  [name], DO 11/08/2022, 7:21 AM  PGY-1, [facility] Family Medicine FPTS Intern pager: [phone], text pages welcome Secure chat group [facility] Teaching Service

## 2022-11-09 DIAGNOSIS — J441 Chronic obstructive pulmonary disease with (acute) exacerbation: Secondary | ICD-10-CM | POA: Diagnosis not present

## 2022-11-09 LAB — CBC
HCT: 34.6 % — ABNORMAL LOW (ref 39.0–52.0)
Hemoglobin: 10.4 g/dL — ABNORMAL LOW (ref 13.0–17.0)
MCH: 23.7 pg — ABNORMAL LOW (ref 26.0–34.0)
MCHC: 30.1 g/dL (ref 30.0–36.0)
MCV: 79 fL — ABNORMAL LOW (ref 80.0–100.0)
Platelets: 431 10*3/uL — ABNORMAL HIGH (ref 150–400)
RBC: 4.38 MIL/uL (ref 4.22–5.81)
RDW: 14.6 % (ref 11.5–15.5)
WBC: 16.5 10*3/uL — ABNORMAL HIGH (ref 4.0–10.5)
nRBC: 0 % (ref 0.0–0.2)

## 2022-11-09 LAB — CULTURE, RESPIRATORY W GRAM STAIN: Culture: NORMAL

## 2022-11-09 MED ORDER — GUAIFENESIN ER 600 MG PO TB12
600.0000 mg | ORAL_TABLET | Freq: Two times a day (BID) | ORAL | Status: DC | PRN
  Administered 2022-11-09: 600 mg via ORAL
  Filled 2022-11-09: qty 1

## 2022-11-09 NOTE — Evaluation (Signed)
Clinical/Bedside Swallow Evaluation Patient Details  Name: Joseph Ellis MRN: [id number] Date of Birth: December 30, 1960  Today's Date: 11/09/2022 Time: SLP Start Time (ACUTE ONLY): 1015 SLP Stop Time (ACUTE ONLY): 1032 SLP Time Calculation (min) (ACUTE ONLY): 17 min  Past Medical History:  Past Medical History:  Diagnosis Date   Arthritis    HANDS & FEET   Bronchitis    COPD (chronic obstructive pulmonary disease) (HCC)    Influenza 03/26/2018   Pneumonia    HX OF PNA   Shortness of breath    Past Surgical History:  Past Surgical History:  Procedure Laterality Date   NO PAST SURGERIES     HPI:  Joseph Ellis is a 62 y.o. male presenting from urgent care with shortness of breath. He was diagnosed with acute on chronic respiratory failure, COPD exacerbation, anemia and malnutrition.  CT of the chest was showing bronchial wall thickening and waxing/waning nodularity which is overall worsened when compared to prior exam likely secondary to atypical infection or recurrent aspiration as well as a small amount of layering bubbly debris in the posterior trachea consistent wtih aspiration.    Assessment / Plan / Recommendation  Clinical Impression  Clinical swallowing evaluation was completed with thin liquids via cup and straw, pureed material and dry solids. RN approved patient for PO intake and was reporting no obvious issues with intake.  The patient did not endorse any trouble swallowing.  Cranial nerve exam was completed and unremarkable.  Lingual, labial, facial and jaw range of motion and strength appeared to be adequate.  Facial sensation appeared to be intact and he did not endorse a difference in sensation between the right and left side of his face.  His oral and pharyngeal swallow appeared to be functional.  Mastication of dry solids appeared to be functional with good oral clearance.  Swallow trigger was appreciated to palpation and overt s/s of aspiration were not seen. In  addition, he passed the [facility] Protocol making the likelihood of aspiration low.  Suggest he remain on a regular diet with thin liquids.   ST will follow briefly given results of chest imaging. SLP Visit Diagnosis: Dysphagia, unspecified (R13.10)    Aspiration Risk  Mild aspiration risk    Diet Recommendation   Thin;Age appropriate regular  Medication Administration: Whole meds with liquid    Other  Recommendations Oral Care Recommendations: Oral care BID    Recommendations for follow up therapy are one component of a multi-disciplinary discharge planning process, led by the attending physician.  Recommendations may be updated based on patient status, additional functional criteria and insurance authorization.  Follow up Recommendations Other (comment) (TBD but likely will not have needs.)         Functional Status Assessment Patient has had a recent decline in their functional status and demonstrates the ability to make significant improvements in function in a reasonable and predictable amount of time.  Frequency and Duration min 1 x/week  2 weeks       Prognosis Prognosis for improved oropharyngeal function: Good      Swallow Study   General Date of Onset: 11/06/22 HPI: Joseph Ellis is a 62 y.o. male presenting from urgent care with shortness of breath. Differential for presentation of this includes COPD exacerbation, PE, asthma exacerbation, CHF, pneumothorax, arrhythmia, anemia.  He was diagnosed with acute on chronic respiratory failure, COPD exacerbation, anermia and malnutrition.  CT of the chest was showing bronchial wall thickening and waxing/waning nodularity which  is overall worsened when compared to prior exam likely secondary to atypical infection or recurrent aspiration as well as a small amount of layering bubbly debris in the posterior trachea consistent wtih aspiration. Type of Study: Bedside Swallow Evaluation Previous Swallow Assessment: None noted at  [facility] Diet Prior to this Study: Regular;Thin liquids (Level 0) Temperature Spikes Noted: No Respiratory Status: Nasal cannula History of Recent Intubation: No Behavior/Cognition: Alert;Cooperative;Pleasant mood Oral Cavity Assessment: Within Functional Limits Oral Care Completed by SLP: No Oral Cavity - Dentition: Dentures, bottom;Dentures, top Vision: Functional for self-feeding Self-Feeding Abilities: Able to feed self Patient Positioning: Upright in bed Baseline Vocal Quality: Normal Volitional Swallow: Able to elicit    Oral/Motor/Sensory Function Overall Oral Motor/Sensory Function: Within functional limits   Ice Chips Ice chips: Not tested   Thin Liquid Thin Liquid: Within functional limits Presentation: Cup;Straw    Nectar Thick Nectar Thick Liquid: Not tested   Honey Thick Honey Thick Liquid: Not tested   Puree Puree: Within functional limits Presentation: Spoon;Self Fed   Solid     Solid: Within functional limits Presentation: Self Fed     Dimas Aguas, MA, CCC-SLP Acute Rehab SLP [phone]  Fleet Contras 11/09/2022,10:41 AM

## 2022-11-09 NOTE — Plan of Care (Signed)
  Problem: Education: Goal: Knowledge of disease or condition will improve Outcome: Progressing Goal: Knowledge of the prescribed therapeutic regimen will improve Outcome: Progressing Goal: Individualized Educational Video(s) Outcome: Progressing   Problem: Activity: Goal: Ability to tolerate increased activity will improve Outcome: Progressing Goal: Will verbalize the importance of balancing activity with adequate rest periods Outcome: Progressing   Problem: Respiratory: Goal: Ability to maintain a clear airway will improve Outcome: Progressing Goal: Levels of oxygenation will improve Outcome: Progressing Goal: Ability to maintain adequate ventilation will improve Outcome: Progressing   Problem: Education: Goal: Knowledge of General Education information will improve Description: Including pain rating scale, medication(s)/side effects and non-pharmacologic comfort measures Outcome: Progressing   Problem: Health Behavior/Discharge Planning: Goal: Ability to manage health-related needs will improve Outcome: Progressing   Problem: Clinical Measurements: Goal: Ability to maintain clinical measurements within normal limits will improve Outcome: Progressing Goal: Will remain free from infection Outcome: Progressing Goal: Diagnostic test results will improve Outcome: Progressing Goal: Respiratory complications will improve Outcome: Progressing Goal: Cardiovascular complication will be avoided Outcome: Progressing   Problem: Activity: Goal: Risk for activity intolerance will decrease Outcome: Progressing   Problem: Nutrition: Goal: Adequate nutrition will be maintained Outcome: Progressing   Problem: Coping: Goal: Level of anxiety will decrease Outcome: Progressing   Problem: Elimination: Goal: Will not experience complications related to bowel motility Outcome: Progressing Goal: Will not experience complications related to urinary retention Outcome: Progressing    Problem: Pain Management: Goal: General experience of comfort will improve Outcome: Progressing   Problem: Safety: Goal: Ability to remain free from injury will improve Outcome: Progressing   Problem: Skin Integrity: Goal: Risk for impaired skin integrity will decrease Outcome: Progressing

## 2022-11-10 ENCOUNTER — Other Ambulatory Visit (HOSPITAL_COMMUNITY): Payer: Self-pay

## 2022-11-10 LAB — CBC
HCT: 36.8 % — ABNORMAL LOW (ref 39.0–52.0)
Hemoglobin: 10.9 g/dL — ABNORMAL LOW (ref 13.0–17.0)
MCH: 23.9 pg — ABNORMAL LOW (ref 26.0–34.0)
MCHC: 29.6 g/dL — ABNORMAL LOW (ref 30.0–36.0)
MCV: 80.7 fL (ref 80.0–100.0)
Platelets: 467 10*3/uL — ABNORMAL HIGH (ref 150–400)
RBC: 4.56 MIL/uL (ref 4.22–5.81)
RDW: 14.6 % (ref 11.5–15.5)
WBC: 14.9 10*3/uL — ABNORMAL HIGH (ref 4.0–10.5)
nRBC: 0 % (ref 0.0–0.2)

## 2022-11-10 LAB — ACID FAST SMEAR (AFB, MYCOBACTERIA): Acid Fast Smear: NEGATIVE

## 2022-11-10 MED ORDER — NICOTINE 21 MG/24HR TD PT24: 21.0000 mg | MEDICATED_PATCH | Freq: Every day | TRANSDERMAL | Status: AC

## 2022-11-10 MED ORDER — ANORO ELLIPTA 62.5-25 MCG/ACT IN AEPB
1.0000 | INHALATION_SPRAY | Freq: Every day | RESPIRATORY_TRACT | 0 refills | Status: DC
  Filled 2022-11-10: qty 60, 60d supply, fill #0

## 2022-11-10 MED ORDER — PREDNISONE 20 MG PO TABS
40.0000 mg | ORAL_TABLET | Freq: Every day | ORAL | 0 refills | Status: AC
  Filled 2022-11-10: qty 2, 1d supply, fill #0

## 2022-11-10 MED ORDER — FERROUS SULFATE 325 (65 FE) MG PO TABS
325.0000 mg | ORAL_TABLET | ORAL | 3 refills | Status: AC
  Filled 2022-11-10: qty 30, 60d supply, fill #0

## 2022-11-10 MED ORDER — ENSURE ENLIVE PO LIQD: 237.0000 mL | Freq: Two times a day (BID) | ORAL | Status: AC

## 2022-11-10 MED ORDER — GUAIFENESIN ER 600 MG PO TB12: 600.0000 mg | ORAL_TABLET | Freq: Two times a day (BID) | ORAL | Status: AC | PRN

## 2022-11-10 NOTE — Assessment & Plan Note (Deleted)
Tolerating room air this morning, he will walk with PT today to make sure that his oxygenation remains appropriate with ambulation.  If patient desats on ambulation may still require home oxygen.  Will likely go home on LABA LAMA daily treatment with continued use of rescue albuterol.  Patient will need follow-up for chronic lung infection. -Sputum cultures pending -Wean oxygen as tolerated -Continue DuoNebs prn -Pulmonology following, appreciate recs -Continue Budesonide, Rosalyn Gess, Yupelri -Doxycycline and prednisone x 5 days-today is day 4 -Ambulatory O2 prior to discharge

## 2022-11-10 NOTE — Discharge Summary (Addendum)
Family Medicine Teaching [facility] Discharge Summary  Patient name: Joseph Ellis Medical record number: [id number] Date of birth: [birth year] Age: 62 y.o. Gender: male Date of Admission: 11/06/2022  Date of Discharge: 11/10/2022 Admitting Physician: Cyndia Skeeters, DO  Primary Care Provider: Cain Saupe, MD Consultants: Pulmonology  Indication for Hospitalization: Acute Hypoxia  Discharge Diagnoses/Problem List:  Principal Problem for Admission: COPD exacerbation Other Problems addressed during stay:  Principal Problem:   COPD exacerbation [facility])  Brief Hospital Course:  Joseph Ellis is a 62 y.o.male with a history of arthritis, COPD smoking 1 pack/day since 62 years old, history of pneumonia who was admitted to the family medicine teaching Service at [facility] for dyspnea. His hospital course is detailed below:  Dyspnea COPD exacerbation? Mycobacterium Infection Patient reports open "mold exposure" as the cause of his increased sputum production over the last couple of days.  Chest x-ray with hyperinflation but otherwise negative.  Treated the patient for COPD exacerbation with prednisone and doxycycline with improvement. CT showed evidence of nodules suspicious for NTM (non-tuberculosis mycobacterium) and aspiration. Pulm was consulted for sputum culture and treatment optimization. They initiated triple inhaler therapy, to which the patient responded well. On discharged he was tolerating RA, and lung exams were improved.  Sputum cultures pending.  Initial routine culture showed only respiratory flora.  Malnutrition  Seemingly cachectic on admission. Could be related to chronic conditions or living situation.   Other chronic conditions were medically managed with home medications and formulary alternatives as necessary (Anemia)  PCP Follow-up Recommendations: Assess sputum culture AFB results given pending at dc LDCT outpatient for lung cancer screening in setting of  tobacco use IV iron outpatient given IDA Colonoscopy outpatient for colon cancer screening Follow-up outpatient pulmonologist in 2 to 3 weeks Fu with [facility] on 10/31, [name] will be PCP  Disposition: Home  Discharge Condition: Stable  Discharge Exam:  Vitals:   11/10/22 0837 11/10/22 1100  BP:    Pulse: 88   Resp: 18   Temp:    SpO2: 91% 91%   General: Patient is alert and oriented, no obvious distress Cardiology: RRR, no M/R/G Respiratory: Diffuse bilateral coarse crackles in all lung fields, consistent with movement of secretions.  Prolonged expiratory phase as seen at baseline. Extremities: 2+ peripheral pulses x 4.  Warm and well-perfused  Significant Procedures: None  Significant Labs and Imaging:  Recent Labs  Lab 11/09/22 0827 11/10/22 0500  WBC 16.5* 14.9*  HGB 10.4* 10.9*  HCT 34.6* 36.8*  PLT 431* 467*      Latest Ref Rng & Units 11/07/2022    7:38 AM 11/06/2022   11:34 AM 04/22/2018    1:34 PM  CMP  Glucose 70 - 99 mg/dL 98  253  664   BUN 8 - 23 mg/dL 13  14  6    Creatinine 0.61 - 1.24 mg/dL 4.03  4.74  2.59   Sodium 135 - 145 mmol/L 135  138  140   Potassium 3.5 - 5.1 mmol/L 4.0  4.1  4.0   Chloride 98 - 111 mmol/L 100  102  101   CO2 22 - 32 mmol/L 25  26  24    Calcium 8.9 - 10.3 mg/dL 8.8  8.4  8.7   Total Protein 6.5 - 8.1 g/dL  6.7  5.9   Total Bilirubin 0.3 - 1.2 mg/dL  0.6  0.5   Alkaline Phos 38 - 126 U/L  49  51   AST 15 -  41 U/L  15  14   ALT 0 - 44 U/L  12  11      CT chest without contrast IMPRESSION: 1. Bronchial wall thickening and waxing and waning nodularity which is overall worsened when compared with the prior exam, likely due to chronic atypical infection such as non tuberculous mycobacterial or recurrent aspiration. 2. Small amount of layering bubbly debris seen in the posterior trachea, consistent with aspiration. 3. Coronary artery calcifications, aortic Atherosclerosis (ICD10-I70.0) and Emphysema  (ICD10-J43.9).  Results/Tests Pending at Time of Discharge: AFB smear and culture  Discharge Medications:  Allergies as of 11/10/2022   No Known Allergies      Medication List     STOP taking these medications    naproxen sodium 220 MG tablet Commonly known as: ALEVE   NYQUIL PO       TAKE these medications    albuterol 108 (90 Base) MCG/ACT inhaler Commonly known as: VENTOLIN HFA Inhale 2 puffs into the lungs every 6 (six) hours as needed for wheezing or shortness of breath.   Anoro Ellipta 62.5-25 MCG/ACT Aepb Generic drug: umeclidinium-vilanterol Inhale 1 puff into the lungs daily.   feeding supplement Liqd Take 237 mLs by mouth 2 (two) times daily between meals.   FeroSul 325 (65 Fe) MG tablet Generic drug: ferrous sulfate Take 1 tablet (325 mg total) by mouth every other day. Start taking on: November 11, 2022   guaiFENesin 600 MG 12 hr tablet Commonly known as: MUCINEX Take 1 tablet (600 mg total) by mouth 2 (two) times daily as needed for to loosen phlegm.   nicotine 21 mg/24hr patch Commonly known as: NICODERM CQ - dosed in mg/24 hours Place 1 patch (21 mg total) onto the skin daily. Start taking on: November 11, 2022   predniSONE 20 MG tablet Commonly known as: DELTASONE Take 2 tablets (40 mg total) by mouth daily with breakfast for 1 day. Start taking on: November 11, 2022        Discharge Instructions: Please refer to Patient Instructions section of EMR for full details.  Patient was counseled important signs and symptoms that should prompt return to medical care, changes in medications, dietary instructions, activity restrictions, and follow up appointments.   Follow-Up Appointments:  Follow-up Information     [facility]. Go on 11/13/2022.   Why: at 3:15 PM with Dr. Rexene Alberts. Contact information: [address] [place] [address] [phone]                [name], DO 11/10/2022,  12:43 PM PGY-1, [facility] Family Medicine   I agree with the assessment and plan as documented above.  Janeal Holmes, MD PGY-2, [facility] Family Medicine

## 2022-11-10 NOTE — Progress Notes (Signed)
AVS given and went over with patient. Pt has no questions at this time. Meds being delivered from [facility] pharmacy. Ride will be here in 30 mins.

## 2022-11-10 NOTE — Discharge Instructions (Addendum)
It was a pleasure to care for you in the hospital and we are so glad that you are feeling better!  While in the hospital we treated your COPD exacerbation. Your symptoms are improving, but you may have ongoing cough for the next few weeks. Please review your discharge medications carefully, as we have made a few changes to better manage your condition.  You will need to have follow-up with pulmonology in 2-3 weeks, they should call you to schedule that if it hasn't been set up already. You will also have follow up with our outpatient clinic on Thursday, October 31st.  If you experience any of the following symptoms, please return to the hospital: - Chest pain -Shortness of breath -New or worsening cough -shortness of breath with talking or walking -fatigue or weakness  For any other questions or concerns or medication refill needs please contact your primary care provider.

## 2022-11-10 NOTE — TOC Benefit Eligibility Note (Addendum)
Patient Product/process development scientist completed.    The patient is insured through [facility] [employer].     Ran test claim for Trelegy Ellipta 200-62.5-25 mcg and Requires Prior Authorization  Ran test claim for Anoro Ellipta 62.5-25 mcg and 30 day supply copay is $4.00  This test claim was processed through [employer]- copay amounts may vary at other pharmacies due to [employer], or as the patient moves through the different stages of their insurance plan.     Roland Earl, CPHT Pharmacy Technician III Certified Patient Advocate [facility] Patient Advocate Team Direct Number: [phone]  Fax: [phone]

## 2022-11-13 ENCOUNTER — Encounter: Payer: Self-pay | Admitting: Family Medicine

## 2022-11-13 ENCOUNTER — Ambulatory Visit (INDEPENDENT_AMBULATORY_CARE_PROVIDER_SITE_OTHER): Payer: Medicaid Other | Admitting: Family Medicine

## 2022-11-13 VITALS — BP 120/78 | HR 90 | Ht 69.0 in | Wt 120.0 lb

## 2022-11-13 DIAGNOSIS — Z1211 Encounter for screening for malignant neoplasm of colon: Secondary | ICD-10-CM | POA: Diagnosis not present

## 2022-11-13 DIAGNOSIS — D509 Iron deficiency anemia, unspecified: Secondary | ICD-10-CM | POA: Diagnosis not present

## 2022-11-13 DIAGNOSIS — J449 Chronic obstructive pulmonary disease, unspecified: Secondary | ICD-10-CM | POA: Diagnosis not present

## 2022-11-13 DIAGNOSIS — Z122 Encounter for screening for malignant neoplasm of respiratory organs: Secondary | ICD-10-CM | POA: Diagnosis present

## 2022-11-13 MED ORDER — ANORO ELLIPTA 62.5-25 MCG/ACT IN AEPB: 1.0000 | INHALATION_SPRAY | Freq: Every day | RESPIRATORY_TRACT | 3 refills | Status: AC

## 2022-11-13 MED ORDER — ARNUITY ELLIPTA 100 MCG/ACT IN AEPB: 100.0000 ug | INHALATION_SPRAY | Freq: Every day | RESPIRATORY_TRACT | 6 refills | Status: AC

## 2022-11-13 NOTE — Assessment & Plan Note (Signed)
Patient taking ferrous sulfate every other day as prescribed in the hospital.  Will hold on IV iron infusions at this time. - Colonoscopy referral placed, patient is amenable to getting this done

## 2022-11-13 NOTE — Patient Instructions (Addendum)
It was great to see you today! Thank you for choosing [facility] for your primary care. Joseph Ellis was seen for hospital follow-up.  Today we addressed: I have ordered a scan of your chest to screen for lung cancer  I have ordered a colonoscopy to screen for colon cancer Let us know if the pulmonologist does not call you in the next 2 weeks Please pick your inhalers up from the pharmacy and start taking them every day Follow up with your PCP in 1 month  If you haven't already, sign up for My Chart to have easy access to your labs results, and communication with your primary care physician.  Call the clinic at [phone] if your symptoms worsen or you have any concerns.  You should return to our clinic Return in about 4 weeks (around 12/11/2022) for copd f/u. Please arrive 15 minutes before your appointment to ensure smooth check in process.  We appreciate your efforts in making this happen.  Thank you for allowing me to participate in your care, Para March, DO 11/13/2022, 4:06 PM

## 2022-11-13 NOTE — Progress Notes (Signed)
    SUBJECTIVE:   CHIEF COMPLAINT / HPI:   Admitted to the hospital from 10/24 to 10/28 for a COPD exacerbation Treated with prednisone and doxycycline CT with evidence of nodules suspicious for nontuberculous Mycobacterium and aspiration, pulmonology initiated triple inhaler therapy and obtain sputum cultures  Patient smoking 1 pack/day, states that he is cut back from 2 packs/day.  States he feels much better since leaving the hospital, he still coughs but states that this is chronic due to his smoking and COPD.  Shortness of breath is greatly improved per patient.  He does note that he was not discharged from the hospital with any inhalers, so he has not used any inhalers since discharge.  PERTINENT  PMH / PSH: COPD, tobacco use, prediabetes  OBJECTIVE:   BP 120/78   Pulse 90   Ht 5\' 9"  (1.753 m)   Wt 120 lb (54.4 kg)   SpO2 95%   BMI 17.72 kg/m   GEN: Chronically ill-appearing, no acute distress, dry cough every few minutes Respiratory: Diffuse scattered expiratory wheezing.  Coarse breath sounds throughout.  Able to speak in complete sentences. CV: Regular rate and rhythm, no murmurs auscultated  ASSESSMENT/PLAN:   COPD (chronic obstructive pulmonary disease) Presented for hospital follow-up.  Sputum cultures for acid-fast bacilli negative. - Low-dose CT scanning to screen for lung cancer has been ordered - Patient to follow-up with [place] pulmonology in 2 to 3 weeks, he has not heard from them yet regarding an appointment - Sent in Anoro and Arnuity Ellipta inhalers for triple therapy, patient received this in the hospital with significant improvement - Advised to follow-up with PCP in 1 month  Iron deficiency anemia Patient taking ferrous sulfate every other day as prescribed in the hospital.  Will hold on IV iron infusions at this time. - Colonoscopy referral placed, patient is amenable to getting this done   Para March, DO [facility]

## 2022-11-13 NOTE — Assessment & Plan Note (Signed)
Presented for hospital follow-up.  Sputum cultures for acid-fast bacilli negative. - Low-dose CT scanning to screen for lung cancer has been ordered - Patient to follow-up with [place] pulmonology in 2 to 3 weeks, he has not heard from them yet regarding an appointment - Sent in Anoro and Arnuity Ellipta inhalers for triple therapy, patient received this in the hospital with significant improvement - Advised to follow-up with PCP in 1 month

## 2022-11-17 ENCOUNTER — Encounter: Payer: Self-pay | Admitting: Family Medicine

## 2022-11-26 ENCOUNTER — Ambulatory Visit
Admission: RE | Admit: 2022-11-26 | Discharge: 2022-11-26 | Disposition: A | Discharge status: Home / Self Care | Payer: Medicaid Other | Source: Ambulatory Visit | Attending: Family Medicine | Admitting: Family Medicine

## 2022-11-26 DIAGNOSIS — Z122 Encounter for screening for malignant neoplasm of respiratory organs: Secondary | ICD-10-CM

## 2022-12-01 NOTE — Telephone Encounter (Signed)
Seen on floor with Dr. Katrinka Blazing. Recommended op f/u 2-3 weeks post-discharge for nonTB infection vs other.

## 2022-12-16 ENCOUNTER — Telehealth: Payer: Self-pay

## 2022-12-16 NOTE — Telephone Encounter (Signed)
Patient calls nurse line requesting CXR results.   Patient advised his imaging is not back yet. I apologized for the inconvenience and advised we will call him as soon as imaging report comes back.   Discussed the abnormal wait times with Radiology at this time.   Patient was appreciative of information.

## 2022-12-22 ENCOUNTER — Encounter: Payer: Self-pay | Admitting: Internal Medicine

## 2022-12-22 NOTE — Progress Notes (Signed)
Patient called.  Patient aware of benign low dose CT and will plan to repeat imaging in 1 year.

## 2022-12-26 ENCOUNTER — Encounter: Payer: Self-pay | Admitting: Family Medicine

## 2022-12-26 LAB — ACID FAST CULTURE WITH REFLEXED SENSITIVITIES (MYCOBACTERIA): Acid Fast Culture: NEGATIVE

## 2023-01-13 ENCOUNTER — Telehealth: Payer: Self-pay

## 2023-01-13 NOTE — Telephone Encounter (Signed)
Multiple attempts to mobile phone and call rang and then quick busy. Then call to other phone on pt acct and phone would be answered with no verbalization and then hang up. No show letter mailed and previsit and colonoscopy canceled.

## 2023-01-22 ENCOUNTER — Encounter: Payer: Medicaid Other | Admitting: Internal Medicine
